# Patient Record
Sex: Female | Born: 1975 | Race: Black or African American | Hispanic: No | Smoking: Never smoker
Health system: Southern US, Community
[De-identification: ages and names within clinical notes are randomized; demographics above are authoritative.]

## PROBLEM LIST (undated history)

## (undated) DIAGNOSIS — O24419 Gestational diabetes mellitus in pregnancy, unspecified control: Secondary | ICD-10-CM

## (undated) HISTORY — PX: DILATION AND CURETTAGE OF UTERUS: SHX78

---

## 1998-09-08 ENCOUNTER — Encounter: Admission: RE | Admit: 1998-09-08 | Discharge: 1998-09-08 | Payer: Self-pay | Admitting: Family Medicine

## 1998-09-11 ENCOUNTER — Emergency Department (HOSPITAL_COMMUNITY): Admission: EM | Admit: 1998-09-11 | Discharge: 1998-09-11 | Payer: Self-pay | Admitting: Emergency Medicine

## 1998-09-17 ENCOUNTER — Encounter: Admission: RE | Admit: 1998-09-17 | Discharge: 1998-09-17 | Payer: Self-pay | Admitting: Family Medicine

## 1998-10-09 ENCOUNTER — Encounter: Admission: RE | Admit: 1998-10-09 | Discharge: 1998-10-09 | Payer: Self-pay | Admitting: Family Medicine

## 1999-06-04 ENCOUNTER — Other Ambulatory Visit: Admission: RE | Admit: 1999-06-04 | Discharge: 1999-06-04 | Payer: Self-pay | Admitting: Obstetrics and Gynecology

## 2000-03-07 ENCOUNTER — Other Ambulatory Visit: Admission: RE | Admit: 2000-03-07 | Discharge: 2000-03-07 | Payer: Self-pay | Admitting: Gynecology

## 2001-03-09 ENCOUNTER — Other Ambulatory Visit: Admission: RE | Admit: 2001-03-09 | Discharge: 2001-03-09 | Payer: Self-pay | Admitting: Gynecology

## 2001-11-10 ENCOUNTER — Emergency Department (HOSPITAL_COMMUNITY): Admission: EM | Admit: 2001-11-10 | Discharge: 2001-11-10 | Payer: Self-pay | Admitting: Emergency Medicine

## 2002-03-12 ENCOUNTER — Other Ambulatory Visit: Admission: RE | Admit: 2002-03-12 | Discharge: 2002-03-12 | Payer: Self-pay | Admitting: Gynecology

## 2003-03-18 ENCOUNTER — Other Ambulatory Visit: Admission: RE | Admit: 2003-03-18 | Discharge: 2003-03-18 | Payer: Self-pay | Admitting: Gynecology

## 2003-06-25 ENCOUNTER — Other Ambulatory Visit: Admission: RE | Admit: 2003-06-25 | Discharge: 2003-06-25 | Payer: Self-pay | Admitting: Gynecology

## 2005-06-06 ENCOUNTER — Encounter: Admission: RE | Admit: 2005-06-06 | Discharge: 2005-09-04 | Payer: Self-pay | Admitting: Obstetrics and Gynecology

## 2005-09-27 ENCOUNTER — Inpatient Hospital Stay (HOSPITAL_COMMUNITY): Admission: AD | Admit: 2005-09-27 | Discharge: 2005-09-27 | Payer: Self-pay | Admitting: *Deleted

## 2005-09-29 ENCOUNTER — Encounter (INDEPENDENT_AMBULATORY_CARE_PROVIDER_SITE_OTHER): Payer: Self-pay | Admitting: Specialist

## 2005-09-29 ENCOUNTER — Ambulatory Visit (HOSPITAL_COMMUNITY): Admission: AD | Admit: 2005-09-29 | Discharge: 2005-09-29 | Payer: Self-pay | Admitting: Obstetrics and Gynecology

## 2006-04-11 ENCOUNTER — Encounter (INDEPENDENT_AMBULATORY_CARE_PROVIDER_SITE_OTHER): Payer: Self-pay | Admitting: *Deleted

## 2006-04-11 ENCOUNTER — Inpatient Hospital Stay (HOSPITAL_COMMUNITY): Admission: AD | Admit: 2006-04-11 | Discharge: 2006-04-11 | Payer: Self-pay | Admitting: Obstetrics and Gynecology

## 2006-05-26 ENCOUNTER — Ambulatory Visit (HOSPITAL_COMMUNITY): Admission: RE | Admit: 2006-05-26 | Discharge: 2006-05-26 | Payer: Self-pay | Admitting: Obstetrics and Gynecology

## 2007-06-27 ENCOUNTER — Ambulatory Visit (HOSPITAL_COMMUNITY): Admission: RE | Admit: 2007-06-27 | Discharge: 2007-06-27 | Payer: Self-pay | Admitting: Obstetrics and Gynecology

## 2007-07-07 ENCOUNTER — Inpatient Hospital Stay (HOSPITAL_COMMUNITY): Admission: AD | Admit: 2007-07-07 | Discharge: 2007-07-07 | Payer: Self-pay | Admitting: Obstetrics and Gynecology

## 2007-10-09 ENCOUNTER — Encounter: Admission: RE | Admit: 2007-10-09 | Discharge: 2007-10-09 | Payer: Self-pay | Admitting: Obstetrics and Gynecology

## 2007-12-03 ENCOUNTER — Inpatient Hospital Stay (HOSPITAL_COMMUNITY): Admission: AD | Admit: 2007-12-03 | Discharge: 2007-12-03 | Payer: Self-pay | Admitting: Obstetrics and Gynecology

## 2007-12-19 ENCOUNTER — Inpatient Hospital Stay (HOSPITAL_COMMUNITY): Admission: RE | Admit: 2007-12-19 | Discharge: 2007-12-21 | Payer: Self-pay | Admitting: Obstetrics and Gynecology

## 2007-12-22 ENCOUNTER — Encounter: Admission: RE | Admit: 2007-12-22 | Discharge: 2008-01-18 | Payer: Self-pay | Admitting: Obstetrics and Gynecology

## 2008-01-19 ENCOUNTER — Encounter: Admission: RE | Admit: 2008-01-19 | Discharge: 2008-02-18 | Payer: Self-pay | Admitting: Obstetrics and Gynecology

## 2008-01-30 ENCOUNTER — Encounter: Payer: Self-pay | Admitting: Family

## 2008-02-19 ENCOUNTER — Encounter: Admission: RE | Admit: 2008-02-19 | Discharge: 2008-03-19 | Payer: Self-pay | Admitting: Obstetrics and Gynecology

## 2008-03-20 ENCOUNTER — Encounter: Admission: RE | Admit: 2008-03-20 | Discharge: 2008-04-19 | Payer: Self-pay | Admitting: Obstetrics and Gynecology

## 2008-04-20 ENCOUNTER — Encounter: Admission: RE | Admit: 2008-04-20 | Discharge: 2008-05-19 | Payer: Self-pay | Admitting: Obstetrics and Gynecology

## 2008-05-20 ENCOUNTER — Encounter: Admission: RE | Admit: 2008-05-20 | Discharge: 2008-06-19 | Payer: Self-pay | Admitting: Obstetrics and Gynecology

## 2008-06-20 ENCOUNTER — Encounter: Admission: RE | Admit: 2008-06-20 | Discharge: 2008-07-20 | Payer: Self-pay | Admitting: Obstetrics and Gynecology

## 2008-07-21 ENCOUNTER — Encounter: Admission: RE | Admit: 2008-07-21 | Discharge: 2008-08-19 | Payer: Self-pay | Admitting: Obstetrics and Gynecology

## 2008-08-20 ENCOUNTER — Encounter: Admission: RE | Admit: 2008-08-20 | Discharge: 2008-09-19 | Payer: Self-pay | Admitting: Obstetrics and Gynecology

## 2008-09-20 ENCOUNTER — Encounter: Admission: RE | Admit: 2008-09-20 | Discharge: 2008-10-19 | Payer: Self-pay | Admitting: Obstetrics and Gynecology

## 2008-10-20 ENCOUNTER — Encounter: Admission: RE | Admit: 2008-10-20 | Discharge: 2008-11-08 | Payer: Self-pay | Admitting: Obstetrics and Gynecology

## 2009-11-23 LAB — CONVERTED CEMR LAB: Pap Smear: NORMAL

## 2009-12-02 ENCOUNTER — Ambulatory Visit: Payer: Self-pay | Admitting: Family

## 2009-12-02 DIAGNOSIS — I10 Essential (primary) hypertension: Secondary | ICD-10-CM | POA: Insufficient documentation

## 2009-12-02 DIAGNOSIS — E669 Obesity, unspecified: Secondary | ICD-10-CM | POA: Insufficient documentation

## 2009-12-03 ENCOUNTER — Telehealth: Payer: Self-pay | Admitting: Family

## 2009-12-14 ENCOUNTER — Ambulatory Visit: Payer: Self-pay | Admitting: Family

## 2009-12-14 LAB — CONVERTED CEMR LAB
BUN: 20 mg/dL (ref 6–23)
CO2: 29 meq/L (ref 19–32)
Calcium: 9.6 mg/dL (ref 8.4–10.5)
Chloride: 98 meq/L (ref 96–112)
Cholesterol: 207 mg/dL — ABNORMAL HIGH (ref 0–200)
Creatinine, Ser: 1.3 mg/dL — ABNORMAL HIGH (ref 0.40–1.20)
Glucose, Bld: 101 mg/dL — ABNORMAL HIGH (ref 70–99)
HDL: 41 mg/dL (ref >39)
LDL Cholesterol: 144 mg/dL — ABNORMAL HIGH (ref 0–99)
Potassium: 3.5 meq/L (ref 3.5–5.3)
Sodium: 139 meq/L (ref 135–145)
TSH: 0.643 microintl units/mL (ref 0.350–4.500)
Total CHOL/HDL Ratio: 5
Triglycerides: 111 mg/dL (ref <150)
VLDL: 22 mg/dL (ref 0–40)

## 2009-12-15 ENCOUNTER — Encounter (INDEPENDENT_AMBULATORY_CARE_PROVIDER_SITE_OTHER): Payer: Self-pay | Admitting: *Deleted

## 2009-12-15 ENCOUNTER — Telehealth (INDEPENDENT_AMBULATORY_CARE_PROVIDER_SITE_OTHER): Payer: Self-pay | Admitting: *Deleted

## 2009-12-21 ENCOUNTER — Ambulatory Visit: Payer: Self-pay | Admitting: Family

## 2009-12-21 DIAGNOSIS — Z87898 Personal history of other specified conditions: Secondary | ICD-10-CM

## 2009-12-21 LAB — CONVERTED CEMR LAB
BUN: 13 mg/dL (ref 6–23)
CO2: 28 meq/L (ref 19–32)
Calcium: 9.7 mg/dL (ref 8.4–10.5)
Chloride: 99 meq/L (ref 96–112)
Creatinine, Ser: 1.15 mg/dL (ref 0.40–1.20)
Glucose, Bld: 100 mg/dL — ABNORMAL HIGH (ref 70–99)
Hgb A1c MFr Bld: 5.8 % (ref 4.6–6.1)
Potassium: 2.9 meq/L — ABNORMAL LOW (ref 3.5–5.3)
Sodium: 139 meq/L (ref 135–145)

## 2009-12-22 ENCOUNTER — Telehealth: Payer: Self-pay | Admitting: Family

## 2009-12-30 ENCOUNTER — Ambulatory Visit: Payer: Self-pay | Admitting: Family

## 2009-12-30 LAB — CONVERTED CEMR LAB
BUN: 10 mg/dL (ref 6–23)
CO2: 28 meq/L (ref 19–32)
Calcium: 8.8 mg/dL (ref 8.4–10.5)
Chloride: 104 meq/L (ref 96–112)
Creatinine, Ser: 1.03 mg/dL (ref 0.40–1.20)
Glucose, Bld: 87 mg/dL (ref 70–99)
Potassium: 3.4 meq/L — ABNORMAL LOW (ref 3.5–5.3)
Sodium: 141 meq/L (ref 135–145)

## 2009-12-31 ENCOUNTER — Encounter: Payer: Self-pay | Admitting: Family

## 2010-02-16 ENCOUNTER — Ambulatory Visit: Payer: Self-pay | Admitting: Internal Medicine

## 2010-03-22 ENCOUNTER — Telehealth: Payer: Self-pay | Admitting: Family

## 2010-11-30 NOTE — Assessment & Plan Note (Signed)
Summary: 1 week follow up of labs/tf [facility] WITH PT/MHF   Vital Signs:  Patient profile:   35 year old female Weight:      225.50 pounds BMI:     35.98 Temp:     98.3 degrees F oral Pulse rate:   76 / minute Pulse rhythm:   regular Resp:     16 per minute BP sitting:   128 / 88  (right arm) Cuff size:   large  Vitals Entered By: Mervin Kung CMA (December 30, 2009 3:52 PM) CC: room 3   ! week f/u on BP and headache. Comments Pt states headaches have improved.   CC:  room 3   ! week f/u on BP and headache..  History of Present Illness: Ms Marut is a 35 year old female who presents today in follow up for her HTN and her Migraines.  Last visit she had a BMET drawn which noted a low potassium (2.9).  She was given some potassium supplementation and HCTZ was discontinued.  She was in place started on labetalol which she has been tolerating well.  Patient denies LE edema.  Migraines- patient tells me that she has taken a vacation at her job and her headaches have resolved.  She has not needed to use the imitrex.    Allergies: 1)  ! Penicillin  Physical Exam  General:  Well-developed,well-nourished,in no acute distress; alert,appropriate and cooperative throughout examination Head:  Normocephalic and atraumatic without obvious abnormalities. No apparent alopecia or balding. Lungs:  Normal respiratory effort, chest expands symmetrically. Lungs are clear to auscultation, no crackles or wheezes. Heart:  Normal rate and regular rhythm. S1 and S2 normal without gallop, murmur, click, rub or other extra sounds.   Impression & Recommendations:  Problem # 1:  HYPERTENSION (ICD-401.9) Assessment Unchanged BP control is reasonable-  patient will work hard on diet and exercise as well as low sodium intake.  Tells me she is motivated to do so.  Will continue current dose of labetalol and plan f/u in 2 months. Her updated medication list for this problem includes:    Labetalol Hcl 100 Mg Tabs  (Labetalol hcl) ..... One tablet by mouth bid  BP today: 128/88 Prior BP: 122/88 (12/21/2009)  Labs Reviewed: K+: 2.9 (12/21/2009) Creat: : 1.15 (12/21/2009)   Chol: 207 (12/14/2009)   HDL: 41 (12/14/2009)   LDL: 144 (12/14/2009)   TG: 111 (12/14/2009)  Problem # 2:  MIGRAINES, HX OF (ICD-V13.8) Assessment: Improved Worsened by stress, now resolved.  Monitor- patient plans to change work hours to a less stressful schedule as well.  Problem # 3:  HYPOKALEMIA (ICD-276.8) Assessment: New Was likely due to HCTZ- repeat BMET today Orders: T-Basic Metabolic Panel [id number])  Complete Medication List: 1)  Labetalol Hcl 100 Mg Tabs (Labetalol hcl) .... One tablet by mouth bid 2)  Imitrex 50 Mg Tabs (Sumatriptan succinate) .... Take one tablet as needed for headache.  may repeat in 2 hours x 1 as needed. 3)  Klor-con 20 Meq Pack (Potassium chloride) .... Take two tablets this am and two tablets this evening.  Patient Instructions: 1)  Please work hard on diet exercise and weight loss.   2)  Follow up in 2 months, sooner if problems or concerns.   Current Allergies (reviewed today): ! PENICILLIN

## 2010-11-30 NOTE — Progress Notes (Signed)
Summary: lab results--lmom  Phone Note Outgoing Call   Summary of Call: Please call patient and let her know that I would like to stop her HCTZ due to low potassium.  I will switch her to a different medicine for her BP which may also help her headaches.  She should take the potassium supplement x 1.  Follow up in 1 week. Her diabetic test (A1C) is normal.   Initial call taken by: Lemont Fillers FNP,  December 22, 2009 10:30 PM  Follow-up for Phone Call        left message on machine to return call. Follow-up by: Mervin Kung CMA,  December 23, 2009 10:49 AM  Additional Follow-up for Phone Call Additional follow up Details #1::        left message on machine to return call Additional Follow-up by: Mervin Kung CMA,  December 24, 2009 8:32 AM    Additional Follow-up for Phone Call Additional follow up Details #2::    Notified pt. per Sandford Craze, NP to stop HCTZ due to low potassium. She will pick up new meds and will follow up with Caro Brundidge on 12/29/09. Follow-up by: Mervin Kung CMA,  December 24, 2009 8:44 AM  New/Updated Medications: LABETALOL HCL 100 MG TABS (LABETALOL HCL) one tablet by mouth BID KLOR-CON 20 MEQ PACK (POTASSIUM CHLORIDE) take two tablets this AM and two tablets this evening. Prescriptions: KLOR-CON 20 MEQ PACK (POTASSIUM CHLORIDE) take two tablets this AM and two tablets this evening.  #4 x 0   Entered and Authorized by:   Lemont Fillers FNP   Signed by:   Lemont Fillers FNP on 12/22/2009   Method used:   [facility] to        [facility] [phone]* (retail)       [address]       [place], [place]  [address]       Ph: [phone]       Fax: [phone]   RxID:   [id number] LABETALOL HCL 100 MG TABS (LABETALOL HCL) one tablet by mouth BID  #60 x 0   Entered and Authorized by:   Lemont Fillers FNP   Signed by:   Lemont Fillers FNP on 12/22/2009   Method used:    [facility]ically to        [facility] [phone][facility] (retail)       [address]       [place], [place]  [address]       Ph: [phone]       Fax: [phone]   RxID:   [id number]

## 2010-11-30 NOTE — Progress Notes (Signed)
Summary: obgyn faxing bloodwork and pap records  Phone Note Outgoing Call   Call placed by: marj Call placed to: obgyn Summary of Call: they are faxing her blood work and pap records  Initial call taken by: Roselle Locus,  December 03, 2009 10:34 AM  Follow-up for Phone Call        records received  Roselle Locus  December 03, 2009 10:43 AM

## 2010-11-30 NOTE — Letter (Signed)
   [place] [facility] [address] [place], [place]  [address]  [place] Phone: [phone]      December 31, 2009   Alexandra Walls [address] [place], [place] [address]  RE:  LAB RESULTS  Dear  Ms. Cortright,  The following is an interpretation of your most recent lab tests.  Please take note of any instructions provided or changes to medications that have resulted from your lab work.  ELECTROLYTES:  Stable - no changes needed     Your postassium is improved- just slightly low.  We will plan to repeat at your next visit in April   Sincerely Yours,    Lemont Fillers FNP

## 2010-11-30 NOTE — Progress Notes (Signed)
Summary: f/u appt  Phone Note Outgoing Call   Call placed by: Lemont Fillers FNP,  December 15, 2009 5:08 PM Summary of Call: Please call patient and arrange follow up visit.  I would like to do more testing related to her kidneys and her sugar.  She also needs follow up of her BP. Initial call taken by: Lemont Fillers FNP,  December 15, 2009 5:09 PM  Follow-up for Phone Call        left message on machine .Marland KitchenMarland KitchenDoristine Walls  December 16, 2009 4:15 PM   spoke w/ patient appt scheduled for f/u this coming week...Marland KitchenMarland KitchenDoristine Walls  December 18, 2009 10:00 AM

## 2010-11-30 NOTE — Letter (Signed)
Summary: [name], [place] [address]   Phone: [phone]  Fax: [phone]    12/15/2009 MRN: [id number]  Alexandra Walls [address] [place], [place]  [address]   [name],   Our records indicate that you missed your scheduled appointment with ___Melissa Peggyann Juba, FNP____________ on _____2/15/11_____.  Please contact this office to reschedule your appointment as soon as possible.  It is important that you keep your scheduled appointments with your physician, so we can provide you the best care possible.  Please be advised that there may be a charge for "no show" appointments.    Sincerely,   [place] [facility]

## 2010-11-30 NOTE — Progress Notes (Signed)
Summary: refill request, needs appt--lm  Phone Note [facility]ill Request Message [facility]  [phone] on 03/21/2010 4:25pm  Refills Requested: Medication #1:  LABETALOL HCL 100 MG TABS one tablet by mouth BID   Dosage confirmed as above?Dosage Confirmed   Supply Requested: 3 months   Last Refilled: 02/26/2010   Notes: Requesting 90 day supply at a time Home number was a fax machine.  Left message on [phone] voicemail to return my call.  Pt was due for f/u in May. Needs appt.  Next Appointment Scheduled: none Initial call taken by: Mervin Kung CMA,  Mar 22, 2010 2:07 PM  Follow-up for Phone Call        Home # still a fax #.  Left message on voicemail [phone]  to return my call.  Mervin Kung CMA  Mar 23, 2010 11:44 AM   Additional Follow-up for Phone Call Additional follow up Details #1::        Left message on pharmacy voicemail that refills are denied and pt needs to contact our office.  Still unable to reach pt or leave message at home number.  Mervin Kung CMA  Mar 26, 2010 10:19 AM

## 2010-11-30 NOTE — Assessment & Plan Note (Signed)
Summary: BP F/U AND LABS/CDJ   Vital Signs:  Patient profile:   35 year old female Weight:      222 pounds BMI:     35.42 Temp:     98.1 degrees F oral Pulse rate:   88 / minute Pulse rhythm:   regular Resp:     16 per minute BP sitting:   122 / 88  (right arm) Cuff size:   large  Vitals Entered By: Pearletha Furl CMA (December 21, 2009 3:41 PM) CC: Room 5  Follow up blood pressure Is Patient Diabetic? No Comments Headaches started 2 months ago and getting worse.   CC:  Room 5  Follow up blood pressure.  History of Present Illness: Alexandra Walls is a 35 year old female who presents today for follow up of her blood pressure.  Notes that she has had increased frequency of HA's which often last 1-2 days and are associated with photophobia and phonophobia.  Not relieved by tylenol.  She denies personal history of migraines, but tells me that her mother does have + migraine history.  Sleep is the only thing that helps her headache.  Notes thes HA's were "once in a blue moon," until 2-3 months ago, now they are occuring about once a week.  + associated nausea but no vomitting.  Pain starts on one side of head, then may move to other side of head.    Preventive Screening-Counseling & Management  Alcohol-Tobacco     Smoking Status: never  Allergies: 1)  ! Penicillin  Social History: Smoking Status:  never  Physical Exam  General:  Well-developed,well-nourished,in no acute distress; alert,appropriate and cooperative throughout examination Lungs:  Normal respiratory effort, chest expands symmetrically. Lungs are clear to auscultation, no crackles or wheezes. Heart:  Normal rate and regular rhythm. S1 and S2 normal without gallop, murmur, click, rub or other extra sounds.   Impression & Recommendations:  Problem # 1:  HYPERTENSION (ICD-401.9) Assessment Improved Plan to continue HCTZ, nearing goal.  Patient counselled on low sodium diet, exercise and weight loss Her updated  medication list for this problem includes:    Hydrochlorothiazide 25 Mg Tabs (Hydrochlorothiazide) ..... One tablet by mouth daily  BP today: 122/88 Prior BP: 140/96 (12/02/2009)  Labs Reviewed: K+: 3.5 (12/14/2009) Creat: : 1.30 (12/14/2009)   Chol: 207 (12/14/2009)   HDL: 41 (12/14/2009)   LDL: 144 (12/14/2009)   TG: 111 (12/14/2009)  Orders: T-Basic Metabolic Panel [id number])  Problem # 2:  MIGRAINES, HX OF (ICD-V13.8) Assessment: New Will give trial of imitrex. If no improvement consider referral to HA clinic.  She works nights and plans to return to days, hopefully this will help her HA's as well.    Problem # 3:  HYPERGLYCEMIA (ICD-790.29) Assessment: New mild hyperglycemia on fasting labs, check A1C Orders: T-Hgb A1C ([id number])  Complete Medication List: 1)  Hydrochlorothiazide 25 Mg Tabs (Hydrochlorothiazide) .... One tablet by mouth daily 2)  Imitrex 50 Mg Tabs (Sumatriptan succinate) .... Take one tablet as needed for headache.  may repeat in 2 hours x 1 as needed.  Patient Instructions: 1)  Call if your headaches are not improved with imitrex. 2)  Please schedule a follow-up appointment in 3 months. Prescriptions: IMITREX 50 MG TABS (SUMATRIPTAN SUCCINATE) take one tablet as needed for headache.  May repeat in 2 hours x 1 as needed.  #10 x 0   Entered and Authorized by:   Lemont Fillers FNP   Signed by:   Efraim Kaufmann  Arvil Chaco FNP on 12/21/2009   Method used:   [facility] to        [facility] [phone][facility] (retail)       [address]       [place], [place]  [address]       Ph: [phone]       Fax: [phone]   RxID:   [id number]   Current Allergies (reviewed today): ! PENICILLIN   Immunization History:  Influenza Immunization History:    Influenza:  historical (07/31/2008)

## 2010-11-30 NOTE — Assessment & Plan Note (Signed)
Summary: new to est//lh   Vital Signs:  Patient profile:   35 year old female Height:      66.50 inches Weight:      225 pounds BMI:     35.90 Pulse rate:   70 / minute BP sitting:   140 / 96  (right arm)  Vitals Entered By: Doristine Devoid (December 02, 2009 2:09 PM) CC: NEW EST- ELEVATED BP    CC:  NEW EST- ELEVATED BP .  History of Present Illness: Ms Leys is a 35 year old female who presents to establish care.  Notes that she went to the health department for her yearly physical and was noted to have high blood pressure.  She denies history of high blood pressure in the past.  Preventative - last PAP - last monday, awaiting results (done at health department) Does not exercise. Notes that her diet is not that good- works 3rd shift. 12A-9A.  Notes that her weight has been the same for about 2 years.  Last tetanus shot was more than 10 years ago, declines flu shot today  Allergies (verified): 1)  ! Penicillin  Past History:  Past Medical History: Hypertension obesity  Past Surgical History: Tonsillectomy  Family History: CAD-maternal grandmother MI HTN-mother,maternal grandmother, maternal aunts & uncles DM-maternal grandmother, maternal aunts & uncles STROKE-maternal grandmother COLON CA-no BREAST CA-no  Mom- HTN, DM, ?hyperthyroid, overweight, migraines Dad- deceased, died from lung CA age 9  1/2 brother- overweight 1/2 sister-overweight 1 daughter- born 1-healthy  Social History: married works for [employer] as a Production designer, theatre/television/film never smoked occasional ETOH denies drug use  Review of Systems       Constitutional: Denies Fever ENT:  Denies nasal congestion or sore throat. Resp: Denies cough CV:  Denies Chest Pain GI:  Denies nausea or vomitting GU: Denies dysuria Lymphatic: Denies lymphadenopathy Musculoskeletal:  Denies muscle/joint pain Skin:  Denies Rashes Psychiatric: Denies depression Neuro:  + ha's come and go, last HA lasted 1 week and resolved  with sleep     Physical Exam  General:  Well-developed, overweight AA female, in no acute distress; alert,appropriate and cooperative throughout examination Head:  Normocephalic and atraumatic without obvious abnormalities. No apparent alopecia or balding. Eyes:  PERRLA Ears:  External ear exam shows no significant lesions or deformities.  Otoscopic examination reveals clear canals, tympanic membranes are intact bilaterally without bulging, retraction, inflammation or discharge. Hearing is grossly normal bilaterally. Mouth:  Oral mucosa and oropharynx without lesions or exudates.  Teeth in good repair. Neck:  No deformities, masses, or tenderness noted. Breasts:  declines-had breast exam on monday at health dept.  Lungs:  Normal respiratory effort, chest expands symmetrically. Lungs are clear to auscultation, no crackles or wheezes. Heart:  Normal rate and regular rhythm. S1 and S2 normal without gallop, murmur, click, rub or other extra sounds. Abdomen:  Bowel sounds positive,abdomen soft and non-tender without masses, organomegaly or hernias noted. Msk:  normal ROM and no joint swelling.   Extremities:  No clubbing, cyanosis, edema, or deformity noted with normal full range of motion of all joints.   Neurologic:  alert & oriented X3, strength normal in all extremities, gait normal, and DTRs symmetrical and normal.   Skin:  Intact without suspicious lesions or rashes Cervical Nodes:  No lymphadenopathy noted Psych:  Cognition and judgment appear intact. Alert and cooperative with normal attention span and concentration. No apparent delusions, illusions, hallucinations   Impression & Recommendations:  Problem # 1:  Preventive  Health Care (ICD-V70.0) Will plan tetanus today.  Counselled on exercise and diet and weight loss  Problem # 2:  HYPERTENSION (ICD-401.9) Assessment: New Will start HCTZ, plan patient to f/u in 2 weeks for f/u BP check and BMET Her updated medication list for  this problem includes:    Hydrochlorothiazide 25 Mg Tabs (Hydrochlorothiazide) ..... One tablet by mouth daily  Problem # 3:  OBESITY (ICD-278.00) Assessment: Unchanged Patient counselled on weight loss.  Complete Medication List: 1)  Hydrochlorothiazide 25 Mg Tabs (Hydrochlorothiazide) .... One tablet by mouth daily  Other Orders: Tdap => 3yrs IM ([id number]) Admin 1st Vaccine ([id number])  Patient Instructions: 1)  It is important that you exercise regularly at least 20 minutes 5 times a week. If you develop chest pain, have severe difficulty breathing, or feel very tired , stop exercising immediately and seek medical attention. 2)  You need to lose weight. Consider a lower calorie diet and regular exercise.  3)  Limit your Sodium (Salt). 4)  Please return fasting for the following labs: 5)  BMET, TSH, FLP (v70) 6)  Please schedule a follow-up appointment in 2 weeks. Prescriptions: HYDROCHLOROTHIAZIDE 25 MG TABS (HYDROCHLOROTHIAZIDE) one tablet by mouth daily  #30 x 1   Entered and Authorized by:   Lemont Fillers FNP   Signed by:   Lemont Fillers FNP on 12/02/2009   Method used:   [facility] to        [facility] [phone]* (retail)       [address]       [place], [place]  [address]       Ph: [phone]       Fax: [phone]   RxID:   [id number]    Preventive Care Screening  Pap Smear:    Date:  11/23/2009    Results:  normal     Immunizations Administered:  Tetanus Vaccine:    Vaccine Type: Tdap    Site: left deltoid    Mfr: GlaxoSmithKline    Dose: 0.5 ml    Route: IM    Given by: Doristine Devoid    Exp. Date: 12/26/2011    Lot #: [id number]

## 2011-03-15 NOTE — Op Note (Signed)
Alexandra Walls, Alexandra Walls             ACCOUNT NO.:  [id number]   MEDICAL RECORD NO.:  [id number]          PATIENT TYPE:  AMB   LOCATION:  SDC                           FACILITY:  [facility]   PHYSICIAN:  Maxie Better, M.D.DATE OF BIRTH:  08/22/1976   DATE OF PROCEDURE:  06/27/2007  DATE OF DISCHARGE:                               OPERATIVE REPORT   PREOPERATIVE DIAGNOSES:  1. Cervical incompetence.  2. Intrauterine gestation at 14+ weeks.   PROCEDURE:  McDonald cerclage placement.   POSTOPERATIVE DIAGNOSES:  1. Intrauterine gestation of 14+ weeks.  2. Cervical incompetence.   ANESTHESIA:  Spinal.   SURGEON:  Maxie Better, M.D.   ASSISTANT:  Marlinda Mike, C.N.M.   PROCEDURE:  Under adequate spinal anesthesia, the patient is placed in  the dorsal lithotomy position.  She was externally prepped and draped in  the usual fashion.  Bladder was catheterized for a small amount of  urine.  A weighted speculum was placed in the vagina.  The vagina was  then prepped with sterile water.  The anterior posterior lip of the  cervix was grasped.  Cervix appeared normal in length.  A #1 Prolene  suture was utilized to place a McDonald cerclage starting at 12 o'clock.  The anterior knot was placed at 12 o'clock with a marker using Ethicon  suture.  Some bleeding was noted from the clamping of the cervix which  abated prior to the procedure being complete, at which time all  instruments were then removed from the vagina.   SPECIMEN:  None.   ESTIMATED BLOOD LOSS:  Minimal.   COMPLICATION:  None.   The patient tolerated the procedure well and was transferred to the  recovery room in stable condition.  Fetal heart rate post procedure was  160.     Maxie Better, M.D.  Electronically Signed    [place]/MEDQ  D:  06/27/2007  T:  06/28/2007  Job:  [id number]

## 2011-07-22 LAB — CBC
HCT: 35.3 — ABNORMAL LOW
HCT: 35.8 — ABNORMAL LOW
Hemoglobin: 12
Hemoglobin: 12.5
MCHC: 34
MCHC: 34.8
MCV: 91.7
MCV: 93.2
Platelets: 113 — ABNORMAL LOW
Platelets: 114 — ABNORMAL LOW
RBC: 3.79 — ABNORMAL LOW
RBC: 3.9
RDW: 14.9
RDW: 15.1
WBC: 12.5 — ABNORMAL HIGH
WBC: 9.2

## 2011-07-22 LAB — COMPREHENSIVE METABOLIC PANEL
ALT: 10
AST: 13
Albumin: 2.8 — ABNORMAL LOW
Alkaline Phosphatase: 108
BUN: 3 — ABNORMAL LOW
CO2: 24
Calcium: 9.2
Chloride: 102
Creatinine, Ser: 0.67
GFR calc Af Amer: >60
GFR calc non Af Amer: >60
Glucose, Bld: 84
Potassium: 3.5
Sodium: 135
Total Bilirubin: 1
Total Protein: 5.6 — ABNORMAL LOW

## 2011-07-22 LAB — RPR: RPR Ser Ql: NONREACTIVE

## 2011-07-22 LAB — LACTATE DEHYDROGENASE: LDH: 108

## 2011-07-22 LAB — URIC ACID: Uric Acid, Serum: 3.4

## 2011-07-22 LAB — CCBB MATERNAL DONOR DRAW

## 2011-08-12 LAB — CBC
HCT: 35.9 — ABNORMAL LOW
Hemoglobin: 12.2
MCHC: 34.1
MCV: 90.8
Platelets: 157
RBC: 3.96
RDW: 13.3
WBC: 8.8

## 2011-08-12 LAB — CARDIOLIPIN ANTIBODIES, IGM+IGG
Anticardiolipin IgG: <7 — ABNORMAL LOW (ref <11)
Anticardiolipin IgM: <7 — ABNORMAL LOW (ref <10)

## 2012-03-19 ENCOUNTER — Other Ambulatory Visit: Payer: Self-pay | Admitting: Family Medicine

## 2012-03-19 ENCOUNTER — Other Ambulatory Visit (HOSPITAL_COMMUNITY)
Admission: RE | Admit: 2012-03-19 | Discharge: 2012-03-19 | Disposition: A | Discharge status: Home / Self Care | Payer: 59 | Source: Ambulatory Visit | Attending: Family Medicine | Admitting: Family Medicine

## 2012-03-19 DIAGNOSIS — Z124 Encounter for screening for malignant neoplasm of cervix: Secondary | ICD-10-CM | POA: Insufficient documentation

## 2012-03-19 DIAGNOSIS — Z1159 Encounter for screening for other viral diseases: Secondary | ICD-10-CM | POA: Insufficient documentation

## 2015-07-27 ENCOUNTER — Ambulatory Visit (INDEPENDENT_AMBULATORY_CARE_PROVIDER_SITE_OTHER): Payer: PRIVATE HEALTH INSURANCE | Admitting: Psychology

## 2015-07-27 DIAGNOSIS — F4323 Adjustment disorder with mixed anxiety and depressed mood: Secondary | ICD-10-CM | POA: Diagnosis not present

## 2015-08-12 ENCOUNTER — Ambulatory Visit: Payer: PRIVATE HEALTH INSURANCE | Admitting: Psychology

## 2020-10-25 ENCOUNTER — Emergency Department (INDEPENDENT_AMBULATORY_CARE_PROVIDER_SITE_OTHER)
Admission: EM | Admit: 2020-10-25 | Discharge: 2020-10-25 | Disposition: A | Discharge status: Home / Self Care | Payer: BC Managed Care – PPO | Source: Home / Self Care

## 2020-10-25 ENCOUNTER — Inpatient Hospital Stay (HOSPITAL_COMMUNITY)
Admission: EM | Admit: 2020-10-25 | Discharge: 2020-10-29 | DRG: 638 | Disposition: A | Discharge status: Home / Self Care | Payer: BC Managed Care – PPO | Attending: Student in an Organized Health Care Education/Training Program | Admitting: Student in an Organized Health Care Education/Training Program

## 2020-10-25 ENCOUNTER — Encounter (HOSPITAL_COMMUNITY): Payer: Self-pay | Admitting: Emergency Medicine

## 2020-10-25 ENCOUNTER — Other Ambulatory Visit: Payer: Self-pay

## 2020-10-25 ENCOUNTER — Emergency Department: Admit: 2020-10-25 | Discharge status: Absent without leave | Payer: Self-pay

## 2020-10-25 DIAGNOSIS — E119 Type 2 diabetes mellitus without complications: Secondary | ICD-10-CM | POA: Diagnosis not present

## 2020-10-25 DIAGNOSIS — E1110 Type 2 diabetes mellitus with ketoacidosis without coma: Secondary | ICD-10-CM | POA: Diagnosis present

## 2020-10-25 DIAGNOSIS — Z6833 Body mass index (BMI) 33.0-33.9, adult: Secondary | ICD-10-CM | POA: Diagnosis not present

## 2020-10-25 DIAGNOSIS — Z8632 Personal history of gestational diabetes: Secondary | ICD-10-CM

## 2020-10-25 DIAGNOSIS — Z833 Family history of diabetes mellitus: Secondary | ICD-10-CM

## 2020-10-25 DIAGNOSIS — R000 Tachycardia, unspecified: Secondary | ICD-10-CM | POA: Diagnosis present

## 2020-10-25 DIAGNOSIS — R011 Cardiac murmur, unspecified: Secondary | ICD-10-CM | POA: Diagnosis present

## 2020-10-25 DIAGNOSIS — E669 Obesity, unspecified: Secondary | ICD-10-CM | POA: Diagnosis present

## 2020-10-25 DIAGNOSIS — Z880 Allergy status to penicillin: Secondary | ICD-10-CM | POA: Diagnosis not present

## 2020-10-25 DIAGNOSIS — E860 Dehydration: Secondary | ICD-10-CM | POA: Diagnosis present

## 2020-10-25 DIAGNOSIS — R739 Hyperglycemia, unspecified: Secondary | ICD-10-CM

## 2020-10-25 DIAGNOSIS — R319 Hematuria, unspecified: Secondary | ICD-10-CM | POA: Diagnosis present

## 2020-10-25 DIAGNOSIS — N179 Acute kidney failure, unspecified: Secondary | ICD-10-CM | POA: Diagnosis present

## 2020-10-25 DIAGNOSIS — Z20822 Contact with and (suspected) exposure to covid-19: Secondary | ICD-10-CM | POA: Diagnosis present

## 2020-10-25 HISTORY — DX: Gestational diabetes mellitus in pregnancy, unspecified control: O24.419

## 2020-10-25 LAB — URINALYSIS, ROUTINE W REFLEX MICROSCOPIC
Bacteria, UA: NONE SEEN
Bilirubin Urine: NEGATIVE
Glucose, UA: >=500 mg/dL — AB
Ketones, ur: 20 mg/dL — AB
Leukocytes,Ua: NEGATIVE
Nitrite: NEGATIVE
Protein, ur: NEGATIVE mg/dL
Specific Gravity, Urine: 1.031 — ABNORMAL HIGH (ref 1.005–1.030)
pH: 5 (ref 5.0–8.0)

## 2020-10-25 LAB — I-STAT VENOUS BLOOD GAS, ED
Acid-base deficit: 1 mmol/L (ref 0.0–2.0)
Bicarbonate: 24.9 mmol/L (ref 20.0–28.0)
Calcium, Ion: 1.18 mmol/L (ref 1.15–1.40)
HCT: 43 % (ref 36.0–46.0)
Hemoglobin: 14.6 g/dL (ref 12.0–15.0)
O2 Saturation: 47 %
Potassium: 4.3 mmol/L (ref 3.5–5.1)
Sodium: 138 mmol/L (ref 135–145)
TCO2: 26 mmol/L (ref 22–32)
pCO2, Ven: 44.6 mmHg (ref 44.0–60.0)
pH, Ven: 7.354 (ref 7.250–7.430)
pO2, Ven: 27 mmHg — CL (ref 32.0–45.0)

## 2020-10-25 LAB — I-STAT BETA HCG BLOOD, ED (MC, WL, AP ONLY): I-stat hCG, quantitative: <5 m[IU]/mL (ref <5)

## 2020-10-25 LAB — POCT URINALYSIS DIP (MANUAL ENTRY)
Bilirubin, UA: NEGATIVE
Glucose, UA: 500 mg/dL — AB
Leukocytes, UA: NEGATIVE
Nitrite, UA: NEGATIVE
Protein Ur, POC: NEGATIVE mg/dL
Spec Grav, UA: <=1.005 — AB (ref 1.010–1.025)
Urobilinogen, UA: 0.2 E.U./dL
pH, UA: 5 (ref 5.0–8.0)

## 2020-10-25 LAB — BASIC METABOLIC PANEL
Anion gap: 19 — ABNORMAL HIGH (ref 5–15)
BUN: 17 mg/dL (ref 6–20)
CO2: 20 mmol/L — ABNORMAL LOW (ref 22–32)
Calcium: 9.8 mg/dL (ref 8.9–10.3)
Chloride: 94 mmol/L — ABNORMAL LOW (ref 98–111)
Creatinine, Ser: 1.97 mg/dL — ABNORMAL HIGH (ref 0.44–1.00)
GFR, Estimated: 31 mL/min — ABNORMAL LOW (ref >60)
Glucose, Bld: 800 mg/dL (ref 70–99)
Potassium: 3.7 mmol/L (ref 3.5–5.1)
Sodium: 133 mmol/L — ABNORMAL LOW (ref 135–145)

## 2020-10-25 LAB — CBC
HCT: 41.7 % (ref 36.0–46.0)
Hemoglobin: 13.9 g/dL (ref 12.0–15.0)
MCH: 30.3 pg (ref 26.0–34.0)
MCHC: 33.3 g/dL (ref 30.0–36.0)
MCV: 90.8 fL (ref 80.0–100.0)
Platelets: 177 10*3/uL (ref 150–400)
RBC: 4.59 MIL/uL (ref 3.87–5.11)
RDW: 12.4 % (ref 11.5–15.5)
WBC: 13.9 10*3/uL — ABNORMAL HIGH (ref 4.0–10.5)
nRBC: 0 % (ref 0.0–0.2)

## 2020-10-25 LAB — CBG MONITORING, ED
Glucose-Capillary: >600 mg/dL (ref 70–99)
Glucose-Capillary: 520 mg/dL (ref 70–99)
Glucose-Capillary: 457 mg/dL — ABNORMAL HIGH (ref 70–99)
Glucose-Capillary: 373 mg/dL — ABNORMAL HIGH (ref 70–99)
Glucose-Capillary: 431 mg/dL — ABNORMAL HIGH (ref 70–99)

## 2020-10-25 LAB — RESP PANEL BY RT-PCR (FLU A&B, COVID) ARPGX2
Influenza A by PCR: NEGATIVE
Influenza B by PCR: NEGATIVE
SARS Coronavirus 2 by RT PCR: NEGATIVE

## 2020-10-25 LAB — POCT FASTING CBG KUC MANUAL ENTRY

## 2020-10-25 MED ORDER — POTASSIUM CHLORIDE 10 MEQ/100ML IV SOLN
10.0000 meq | INTRAVENOUS | Status: AC
  Administered 2020-10-25 – 2020-10-26 (×2): 10 meq via INTRAVENOUS

## 2020-10-25 MED ORDER — ENOXAPARIN SODIUM 40 MG/0.4ML ~~LOC~~ SOLN
40.0000 mg | Freq: Every day | SUBCUTANEOUS | Status: DC
  Administered 2020-10-25 – 2020-10-27 (×3): 40 mg via SUBCUTANEOUS
  Filled 2020-10-25 (×3): qty 0.4

## 2020-10-25 MED ORDER — ACETAMINOPHEN 650 MG RE SUPP: 650.0000 mg | Freq: Four times a day (QID) | RECTAL | Status: DC | PRN

## 2020-10-25 MED ORDER — DEXTROSE 50 % IV SOLN: 0.0000 mL | INTRAVENOUS | Status: DC | PRN

## 2020-10-25 MED ORDER — INSULIN REGULAR(HUMAN) IN NACL 100-0.9 UT/100ML-% IV SOLN
INTRAVENOUS | Status: DC
  Administered 2020-10-25: 22:00:00 11.5 [IU]/h via INTRAVENOUS
  Filled 2020-10-25: qty 100

## 2020-10-25 MED ORDER — POTASSIUM CHLORIDE 10 MEQ/100ML IV SOLN
10.0000 meq | INTRAVENOUS | Status: AC
  Administered 2020-10-25 (×2): 10 meq via INTRAVENOUS
  Filled 2020-10-25 (×2): qty 100

## 2020-10-25 MED ORDER — INSULIN REGULAR(HUMAN) IN NACL 100-0.9 UT/100ML-% IV SOLN
INTRAVENOUS | Status: DC
  Administered 2020-10-25: 23:00:00 12 [IU]/h via INTRAVENOUS
  Filled 2020-10-25: qty 100

## 2020-10-25 MED ORDER — LACTATED RINGERS IV BOLUS
20.0000 mL/kg | Freq: Once | INTRAVENOUS | Status: AC
  Administered 2020-10-25: 23:00:00 1906 mL via INTRAVENOUS

## 2020-10-25 MED ORDER — LACTATED RINGERS IV BOLUS
2000.0000 mL | Freq: Once | INTRAVENOUS | Status: AC
  Administered 2020-10-25: 21:00:00 2000 mL via INTRAVENOUS

## 2020-10-25 MED ORDER — ACETAMINOPHEN 325 MG PO TABS
650.0000 mg | ORAL_TABLET | Freq: Four times a day (QID) | ORAL | Status: DC | PRN
  Administered 2020-10-27: 650 mg via ORAL
  Filled 2020-10-25: qty 2

## 2020-10-25 MED ORDER — DEXTROSE IN LACTATED RINGERS 5 % IV SOLN: INTRAVENOUS | Status: DC

## 2020-10-25 MED ORDER — LACTATED RINGERS IV SOLN: INTRAVENOUS | Status: DC

## 2020-10-25 NOTE — ED Notes (Addendum)
Pt able to ambulate to the bathroom

## 2020-10-25 NOTE — ED Notes (Signed)
2 RNs attempted IV access unsuccessfully; IV team consult placed

## 2020-10-25 NOTE — ED Triage Notes (Signed)
Reports frequent urination and thirst since Wednesday.  States she went to [facility] and her sugar was elevated.  Reports history of gestational diabetes.

## 2020-10-25 NOTE — ED Provider Notes (Signed)
[facility]ERGENC[facility] DEPARTMENT Provider Note   CSN: [id number] Arrival date & time: 10/25/20  1513     History Chief Complaint  Patient presents with  . Hyperglycemia    Alexandra Walls is a 44 y.o. female.  Patient is a 44 year old female with a history of gestational diabetes and prior obesity who is presenting from urgent care today due to severe hyperglycemia.  Patient reports over the last 1 week or so she has had significant polyuria, polydipsia, general malaise and fatigue.  She denies any vomiting or abdominal pain.  She has intermittently had dizziness and had some blurred vision.  When she arrived at urgent care today her blood sugar read greater than 600 without prior history of diabetes.  She reports 12 years ago she had gestational diabetes but it resolved after having her child.  She takes no medications at this time.  She has not had any recent illness and denies any cough, congestion or shortness of breath.  The history is provided by the patient.  Hyperglycemia Blood sugar level PTA:  >600 Severity:  Severe      Past Medical History:  Diagnosis Date  . Gestational diabetes    12 years ago     Patient Active Problem List   Diagnosis Date Noted  . MIGRAINES, HX OF 12/21/2009  . OBESITY 12/02/2009  . HYPERTENSION 12/02/2009    Past Surgical History:  Procedure Laterality Date  . DILATION AND CURETTAGE OF UTERUS     x2     OB History   No obstetric history on file.     Family History  Problem Relation Age of Onset  . Hypertension Mother   . Diabetes Mother   . Diabetes Sister     Social History   Tobacco Use  . Smoking status: Never Smoker  . Smokeless tobacco: Never Used  Vaping Use  . Vaping Use: Never used  Substance Use Topics  . Alcohol use: Never  . Drug use: Never    Home Medications Prior to Admission medications   Not on File    Allergies    Penicillins  Review of Systems   Review of Systems   All other systems reviewed and are negative.   Physical Exam Updated Vital Signs BP (!) 163/103   Pulse (!) 107   Temp 98.5 F (36.9 C) (Oral)   Resp (!) 24   LMP 10/25/2020 (Exact Date)   SpO2 97%   Physical Exam Vitals and nursing note reviewed.  Constitutional:      General: She is not in acute distress.    Appearance: She is well-developed, normal weight and well-nourished.  HENT:     Head: Normocephalic and atraumatic.     Mouth/Throat:     Mouth: Mucous membranes are dry.  Eyes:     Extraocular Movements: EOM normal.     Pupils: Pupils are equal, round, and reactive to light.  Cardiovascular:     Rate and Rhythm: Regular rhythm. Tachycardia present.     Pulses: Normal pulses and intact distal pulses.     Heart sounds: Murmur heard.   Systolic murmur is present with a grade of 2/6. No friction rub.  Pulmonary:     Effort: Pulmonary effort is normal.     Breath sounds: Normal breath sounds. No wheezing or rales.  Abdominal:     General: Bowel sounds are normal. There is no distension.     Palpations: Abdomen is soft.  Tenderness: There is no abdominal tenderness. There is no guarding or rebound.  Musculoskeletal:        General: No tenderness. Normal range of motion.     Comments: No edema  Skin:    General: Skin is warm and dry.     Findings: No rash.  Neurological:     General: No focal deficit present.     Mental Status: She is alert and oriented to person, place, and time. Mental status is at baseline.     Cranial Nerves: No cranial nerve deficit.  Psychiatric:        Mood and Affect: Mood and affect and mood normal.        Behavior: Behavior normal.        Thought Content: Thought content normal.      ED Results / Procedures / Treatments   Labs (all labs ordered are listed, but only abnormal results are displayed) Labs Reviewed  BASIC METABOLIC PANEL - Abnormal; Notable for the following components:      Result Value   Sodium 133 (*)     Chloride 94 (*)    CO2 20 (*)    Glucose, Bld 800 (*)    Creatinine, Ser 1.97 (*)    GFR, Estimated 31 (*)    Anion gap 19 (*)    All other components within normal limits  CBC - Abnormal; Notable for the following components:   WBC 13.9 (*)    All other components within normal limits  URINALYSIS, ROUTINE W REFLEX MICROSCOPIC - Abnormal; Notable for the following components:   Color, Urine STRAW (*)    Specific Gravity, Urine 1.031 (*)    Glucose, UA >=500 (*)    Hgb urine dipstick LARGE (*)    Ketones, ur 20 (*)    All other components within normal limits  CBG MONITORING, ED  I-STAT BETA HCG BLOOD, ED ([facility], [facility], [facility] ONLY)  I-STAT VENOUS BLOOD GAS, ED    EKG None  Radiology No results found.  Procedures Procedures (including critical care time)  Medications Ordered in ED Medications  lactated ringers bolus 2,000 mL (has no administration in time range)    ED Course  I have reviewed the triage vital signs and the nursing notes.  Pertinent labs & imaging results that were available during my care of the patient were reviewed by me and considered in my medical decision making (see chart for details).    MDM Rules/Calculators/A&P                          Patient is a 44 year old female presenting today with new onset of diabetes.  Patient has typical hyperglycemia symptoms including polyuria, polydipsia but is not having abdominal pain or vomiting.  She is tachycardic here and labs show early onset of DKA.  Her pregnancy test is negative, UA with 20 ketones but no evidence of infection, abdomen is benign on exam.  BMP today shows a blood sugar of 800, anion gap of 19 and new AKI with creatinine of 1.97.  Potassium is normal at 3.7.  Mild leukocytosis of 13.9 and normal hemoglobin.  Patient given 2 L of LR.  VBG pending.  Patient will need to be placed on Endo tool and continued hyperglycemia reduction and closing the gap.  Findings discussed with the patient and the  plan.  8:40 PM Delay due to poor IV access.  VBG without signs of metabolic acidosis.  Will start  endotool after she recieves 2L  MDM Number of Diagnoses or Management Options   Amount and/or Complexity of Data Reviewed Clinical lab tests: ordered and reviewed Decide to obtain previous medical records or to obtain history from someone other than the patient: yes Obtain history from someone other than the patient: yes Review and summarize past medical records: yes Discuss the patient with other providers: yes Independent visualization of images, tracings, or specimens: yes  Risk of Complications, Morbidity, and/or Mortality Presenting problems: high Diagnostic procedures: moderate Management options: moderate  Patient Progress Patient progress: stable  CRITICAL CARE Performed by: Quavion Boule Total critical care time: 30 minutes Critical care time was exclusive of separately billable procedures and treating other patients. Critical care was necessary to treat or prevent imminent or life-threatening deterioration. Critical care was time spent personally by me on the following activities: development of treatment plan with patient and/or surrogate as well as nursing, discussions with consultants, evaluation of patient's response to treatment, examination of patient, obtaining history from patient or surrogate, ordering and performing treatments and interventions, ordering and review of laboratory studies, ordering and review of radiographic studies, pulse oximetry and re-evaluation of patient's condition.  Final Clinical Impression(s) / ED Diagnoses Final diagnoses:  Hyperglycemia  Diabetic ketoacidosis without coma associated with type 2 diabetes mellitus ([place])  AKI (acute kidney injury) [facility])    Rx / DC Orders ED Discharge Orders    None       Blanchie Dessert, MD 10/25/20 2041

## 2020-10-25 NOTE — ED Notes (Signed)
Date and time results received: 10/25/20 1405 (use smartphrase ".now" to insert current time)  Test: U/A & CBG Critical Value: Glucose on U/A is 537m/dL & CBG is greater than 500  Name of Provider Notified: Dr LJoseph Art Orders Received? Or Actions Taken?: Pt to go to ER via POV - see MD notes

## 2020-10-25 NOTE — ED Notes (Signed)
Dr. Melina Copa notified of glucose 800.

## 2020-10-25 NOTE — ED Provider Notes (Signed)
[facility]    CSN: [id number] Arrival date & time: 10/25/20  1319      History   Chief Complaint Chief Complaint  Patient presents with  . Dehydration    HPI Alexandra Walls is a 44 y.o. female.   This is the initial [place] urgent care visit for this 44 year old woman complaining of polyuria.  Patient had associated polydipsia polyuria and polyphagia.  She has had intermittent blurry vision.  She has had no abdominal pain.  She has a dry mouth and persistent thirst.  Patient has a history of gestational diabetes.     Past Medical History:  Diagnosis Date  . Gestational diabetes    12 years ago     Patient Active Problem List   Diagnosis Date Noted  . MIGRAINES, HX OF 12/21/2009  . OBESITY 12/02/2009  . HYPERTENSION 12/02/2009    Past Surgical History:  Procedure Laterality Date  . DILATION AND CURETTAGE OF UTERUS     x2    OB History   No obstetric history on file.      Home Medications    Prior to Admission medications   Not on File    Family History Family History  Problem Relation Age of Onset  . Hypertension Mother   . Diabetes Mother   . Diabetes Sister     Social History Social History   Tobacco Use  . Smoking status: Never Smoker  . Smokeless tobacco: Never Used  Vaping Use  . Vaping Use: Never used  Substance Use Topics  . Alcohol use: Never  . Drug use: Never     Allergies   Penicillins   Review of Systems Review of Systems  Constitutional: Positive for activity change.  Endocrine: Positive for polydipsia, polyphagia and polyuria.  Neurological: Positive for headaches.     Physical Exam Triage Vital Signs ED Triage Vitals [10/25/20 1357]  Enc Vitals Group     BP      Pulse      Resp      Temp      Temp src      SpO2      Weight 210 lb (95.3 kg)     Height 5' 6"  (1.676 m)     Head Circumference      Peak Flow      Pain Score      Pain Loc      Pain Edu?      Excl. in [place]?    No  data found.  Updated Vital Signs BP (!) 156/107 (BP Location: Left Arm)   Pulse (!) 118   Temp 98.2 F (36.8 C) (Oral)   Resp 15   Ht 5' 6"  (1.676 m)   Wt 95.3 kg   LMP 10/25/2020 (Exact Date)   SpO2 98%   BMI 33.89 kg/m    Physical Exam Vitals and nursing note reviewed.  Constitutional:      Appearance: Normal appearance. She is obese.  HENT:     Nose: Nose normal.     Mouth/Throat:     Mouth: Mucous membranes are dry.     Pharynx: Oropharynx is clear.  Cardiovascular:     Rate and Rhythm: Tachycardia present.     Heart sounds: Normal heart sounds.  Pulmonary:     Effort: Pulmonary effort is normal.     Breath sounds: Normal breath sounds.  Musculoskeletal:        General: Normal range of motion.  Cervical back: Normal range of motion and neck supple.  Skin:    General: Skin is warm and dry.  Neurological:     General: No focal deficit present.     Mental Status: She is alert.  Psychiatric:        Mood and Affect: Mood normal.        Behavior: Behavior normal.        Thought Content: Thought content normal.      UC Treatments / Results  Labs (all labs ordered are listed, but only abnormal results are displayed) Labs Reviewed  POCT URINALYSIS DIP (MANUAL ENTRY) - Abnormal; Notable for the following components:      Result Value   Glucose, UA =500 (*)    Ketones, POC UA small (15) (*)    Spec Grav, UA <=1.005 (*)    Blood, UA moderate (*)    All other components within normal limits  POCT FASTING CBG [place]    EKG   Radiology No results found.  Procedures Procedures (including critical care time)  Medications Ordered in UC Medications - No data to display  Initial Impression / Assessment and Plan / UC Course  I have reviewed the triage vital signs and the nursing notes.  Pertinent labs & imaging results that were available during my care of the patient were reviewed by me and considered in my medical decision making (see chart  for details).     Final Clinical Impressions(s) / UC Diagnoses   Final diagnoses:  New onset type 2 diabetes mellitus ([place])     Discharge Instructions     You have new onset diabetes and this blood sugar today of over 600 will require emergency treatment before pills can be started.    ED Prescriptions    None     I have reviewed the PDMP during this encounter.   Robyn Haber, MD 10/25/20 1415

## 2020-10-25 NOTE — ED Triage Notes (Signed)
Pt reports with polyuria. Pt reports increasing fluid intake as well. Reports 167/120 BP at home.

## 2020-10-25 NOTE — ED Notes (Signed)
Patient is being discharged from the Urgent Care and sent to the Emergency Department via POV w/ family . Per Dr Joseph Art, patient is in need of higher level of care due to higher level of care for BP & elevated CBG (greater than 600) Patient is aware and verbalizes understanding of plan of care.  Vitals:   10/25/20 1401  BP: (!) 156/107  Pulse: (!) 118  Resp: 15  Temp: 98.2 F (36.8 C)  SpO2: 98%

## 2020-10-25 NOTE — Discharge Instructions (Addendum)
You have new onset diabetes and this blood sugar today of over 600 will require emergency treatment before pills can be started.

## 2020-10-26 LAB — BASIC METABOLIC PANEL
Anion gap: 13 (ref 5–15)
Anion gap: 11 (ref 5–15)
Anion gap: 9 (ref 5–15)
Anion gap: 10 (ref 5–15)
BUN: 15 mg/dL (ref 6–20)
BUN: 15 mg/dL (ref 6–20)
BUN: 16 mg/dL (ref 6–20)
BUN: 18 mg/dL (ref 6–20)
CO2: 26 mmol/L (ref 22–32)
CO2: 25 mmol/L (ref 22–32)
CO2: 28 mmol/L (ref 22–32)
CO2: 23 mmol/L (ref 22–32)
Calcium: 9.8 mg/dL (ref 8.9–10.3)
Calcium: 9.8 mg/dL (ref 8.9–10.3)
Calcium: 9.5 mg/dL (ref 8.9–10.3)
Calcium: 9.3 mg/dL (ref 8.9–10.3)
Chloride: 107 mmol/L (ref 98–111)
Chloride: 107 mmol/L (ref 98–111)
Chloride: 111 mmol/L (ref 98–111)
Chloride: 108 mmol/L (ref 98–111)
Creatinine, Ser: 1.54 mg/dL — ABNORMAL HIGH (ref 0.44–1.00)
Creatinine, Ser: 1.36 mg/dL — ABNORMAL HIGH (ref 0.44–1.00)
Creatinine, Ser: 1.35 mg/dL — ABNORMAL HIGH (ref 0.44–1.00)
Creatinine, Ser: 1.5 mg/dL — ABNORMAL HIGH (ref 0.44–1.00)
GFR, Estimated: 42 mL/min — ABNORMAL LOW (ref >60)
GFR, Estimated: 49 mL/min — ABNORMAL LOW (ref >60)
GFR, Estimated: 49 mL/min — ABNORMAL LOW (ref >60)
GFR, Estimated: 44 mL/min — ABNORMAL LOW (ref >60)
Glucose, Bld: 198 mg/dL — ABNORMAL HIGH (ref 70–99)
Glucose, Bld: 218 mg/dL — ABNORMAL HIGH (ref 70–99)
Glucose, Bld: 212 mg/dL — ABNORMAL HIGH (ref 70–99)
Glucose, Bld: 398 mg/dL — ABNORMAL HIGH (ref 70–99)
Potassium: 3.6 mmol/L (ref 3.5–5.1)
Potassium: 3.4 mmol/L — ABNORMAL LOW (ref 3.5–5.1)
Potassium: 3.9 mmol/L (ref 3.5–5.1)
Potassium: 4 mmol/L (ref 3.5–5.1)
Sodium: 146 mmol/L — ABNORMAL HIGH (ref 135–145)
Sodium: 143 mmol/L (ref 135–145)
Sodium: 148 mmol/L — ABNORMAL HIGH (ref 135–145)
Sodium: 141 mmol/L (ref 135–145)

## 2020-10-26 LAB — BETA-HYDROXYBUTYRIC ACID
Beta-Hydroxybutyric Acid: 0.24 mmol/L (ref 0.05–0.27)
Beta-Hydroxybutyric Acid: 0.12 mmol/L (ref 0.05–0.27)
Beta-Hydroxybutyric Acid: 1.75 mmol/L — ABNORMAL HIGH (ref 0.05–0.27)

## 2020-10-26 LAB — CBG MONITORING, ED
Glucose-Capillary: 159 mg/dL — ABNORMAL HIGH (ref 70–99)
Glucose-Capillary: 176 mg/dL — ABNORMAL HIGH (ref 70–99)
Glucose-Capillary: 178 mg/dL — ABNORMAL HIGH (ref 70–99)
Glucose-Capillary: 185 mg/dL — ABNORMAL HIGH (ref 70–99)
Glucose-Capillary: 199 mg/dL — ABNORMAL HIGH (ref 70–99)
Glucose-Capillary: 218 mg/dL — ABNORMAL HIGH (ref 70–99)
Glucose-Capillary: 220 mg/dL — ABNORMAL HIGH (ref 70–99)
Glucose-Capillary: 314 mg/dL — ABNORMAL HIGH (ref 70–99)
Glucose-Capillary: 338 mg/dL — ABNORMAL HIGH (ref 70–99)
Glucose-Capillary: 398 mg/dL — ABNORMAL HIGH (ref 70–99)

## 2020-10-26 LAB — GLUCOSE, CAPILLARY
Glucose-Capillary: 313 mg/dL — ABNORMAL HIGH (ref 70–99)
Glucose-Capillary: 260 mg/dL — ABNORMAL HIGH (ref 70–99)

## 2020-10-26 LAB — HEMOGLOBIN A1C
Hgb A1c MFr Bld: 9.8 % — ABNORMAL HIGH (ref 4.8–5.6)
Mean Plasma Glucose: 234.56 mg/dL

## 2020-10-26 LAB — HIV ANTIBODY (ROUTINE TESTING W REFLEX): HIV Screen 4th Generation wRfx: NONREACTIVE

## 2020-10-26 MED ORDER — POTASSIUM CHLORIDE CRYS ER 20 MEQ PO TBCR
40.0000 meq | EXTENDED_RELEASE_TABLET | Freq: Two times a day (BID) | ORAL | Status: AC
  Administered 2020-10-26 (×2): 40 meq via ORAL
  Filled 2020-10-26 (×2): qty 2

## 2020-10-26 MED ORDER — INSULIN GLARGINE 100 UNIT/ML ~~LOC~~ SOLN
19.0000 [IU] | SUBCUTANEOUS | Status: DC
  Administered 2020-10-26: 12:00:00 19 [IU] via SUBCUTANEOUS
  Filled 2020-10-26 (×2): qty 0.19

## 2020-10-26 MED ORDER — LIVING WELL WITH DIABETES BOOK
Freq: Once | Status: DC
  Filled 2020-10-26: qty 1

## 2020-10-26 MED ORDER — INFLUENZA VAC SPLIT QUAD 0.5 ML IM SUSY: 0.5000 mL | PREFILLED_SYRINGE | INTRAMUSCULAR | Status: DC

## 2020-10-26 MED ORDER — INSULIN ASPART 100 UNIT/ML ~~LOC~~ SOLN
0.0000 [IU] | SUBCUTANEOUS | Status: DC
  Administered 2020-10-26: 8 [IU] via SUBCUTANEOUS
  Administered 2020-10-27 (×2): 15 [IU] via SUBCUTANEOUS
  Administered 2020-10-27: 11 [IU] via SUBCUTANEOUS
  Administered 2020-10-27: 5 [IU] via SUBCUTANEOUS
  Administered 2020-10-27: 8 [IU] via SUBCUTANEOUS

## 2020-10-26 MED ORDER — INSULIN ASPART 100 UNIT/ML ~~LOC~~ SOLN
0.0000 [IU] | SUBCUTANEOUS | Status: DC
  Administered 2020-10-26: 7 [IU] via SUBCUTANEOUS
  Administered 2020-10-26: 15:00:00 9 [IU] via SUBCUTANEOUS
  Administered 2020-10-26: 14:00:00 7 [IU] via SUBCUTANEOUS

## 2020-10-26 NOTE — Progress Notes (Signed)
Pt arrived on the unit alert with clothes, shoes and cell phone and charger. Pt was able to walk from stretcher to bed. Also from the chair to bathroom.

## 2020-10-26 NOTE — ED Notes (Signed)
Hospital bed requested for pt. 

## 2020-10-26 NOTE — Progress Notes (Signed)
Inpatient Diabetes Program Recommendations  AACE/ADA: New Consensus Statement on Inpatient Glycemic Control (2015)  Target Ranges:  Prepandial:   less than 140 mg/dL      Peak postprandial:   less than 180 mg/dL (1-2 hours)      Critically ill patients:  140 - 180 mg/dL   Lab Results  Component Value Date   GLUCAP 338 (H) 10/26/2020   HGBA1C 9.8 (H) 10/26/2020    Review of Glycemic Control Results for [name], Alexandra Walls (MRN [id number]) as of 10/26/2020 14:38  Ref. Range 10/26/2020 06:03 10/26/2020 08:16 10/26/2020 09:52 10/26/2020 11:35 10/26/2020 13:43  Glucose-Capillary Latest Ref Range: 70 - 99 mg/dL 220 (H) 178 (H) 199 (H) 159 (H) 338 (H)   Diabetes history:  GDM 12 years ago New onset DM2 Current orders for Inpatient glycemic control:  Lantus 19 units daily Novolog 0-9 units TID  Note:  Referral for DM management.  Spoke with patient at bedside in ED.  She states she had increased thirst and increased urination over the last couple of weeks.  She has not noticed weight loss.   Spoke with pt about new diagnosis. Discussed A1C results with her and explained what an A1C is, basic pathophysiology of DM Type 2, basic home care, basic diabetes diet nutrition principles, importance of checking CBGs and maintaining good CBG control to prevent long-term and short-term complications. Reviewed signs and symptoms of hyperglycemia and hypoglycemia and how to treat hypoglycemia at home. Also reviewed blood sugar goals at home.  RNs to provide ongoing basic DM education at bedside with this patient. Have ordered educational booklet, and DM education. Have also placed RD consult for DM diet education for this patient. She states she drinks a lot of soda and beverages with sugar.  She also eats a lot of CHO's.  12 years ago she had GDM and followed a strict GDM diet.  She is aware of CHO's.  We discussed The Plate Method and importance of exercise to assist with glucose control.  She states she would  prefer not to start on insulin and would like to try diet modifications and oral medications.  Explained The DM team will follow her closely during inpatient admission and assist with making recommendations.  If insulin is required out team will follow up with her and teach insulin administration.    Please use each patient interaction to provide diabetes education. Please review Living Well with Diabetes booklet with the patient, have patient watch patient education videos on diabetes, and instruct on insulin administration. Please allow patient to be actively engaged with diabetes management by allowing patient to check own glucose and self-administer insulin injections. Diabetes Coordinator will follow up with patient and reinforce diabetes education.   Will continue to follow while inpatient.  Thank you, Reche Dixon, RN, BSN Diabetes Coordinator Inpatient Diabetes Program [phone] (team pager from 8a-5p)

## 2020-10-26 NOTE — ED Notes (Signed)
Pt placed on hospital bed

## 2020-10-26 NOTE — H&P (Signed)
Date: 10/26/2020               Patient Name:  Alexandra Walls MRN: [id number]  DOB: 1976/04/14 Age / Sex: 44 y.o., female   PCP: Parke Simmers., MD         Medical Service: Internal Medicine Teaching Service         Attending Physician: Dr. Aldine Contes, MD    First Contact: Dr. Virl Axe Pager: [phone]  Second Contact: Dr. Maudie Mercury Pager: [phone]       After Hours (After 5p/  First Contact Pager: [phone]  weekends / holidays): Second Contact Pager: [phone]   Chief Complaint: DKA  History of Present Illness:  Patient is a 44 year old female with past medical history of gestational diabetes who presents the ED for blood sugar of 600s.  Patient came to [facility] urgent care and was told to go to the ED.  Patient states that she has been having polyuria, polydipsia, fatigue and blurry vision in the last week.  She denies chest pain, shortness of breath, fever, cough, nausea, vomiting, abdominal pain, dysuria, constipation, diarrhea or headache.  Also denies any sick contacts.  Patient last saw her PCP a few years ago and is not taking any medication.  She goes to the a practice on [address] to get a Depo shot every 3 months.  Patient could not remember the name of the practice of her PCP.   In the ED, CBC shows leukocytosis of 13.9. BMP shows anion gap metabolic acidosis with blood sugar of 800.  UA showed hematuria and ketonuria.  VBG shows normal pH of 7.35.  She received 2 L of LR and was started on Endo tool.  Meds:  Current Meds  Medication Sig  . APPLE CIDER VINEGAR PO Take 1-2 tablets by mouth daily as needed (blood pressure).  . medroxyPROGESTERone (DEPO-PROVERA) 150 MG/ML injection Inject 150 mg into the muscle every 3 (three) months.  . Multiple Vitamins-Minerals (MULTIVITAMIN ADULT) CHEW Chew 1 tablet by mouth daily.     Allergies: Allergies as of 10/25/2020 - Review Complete 10/25/2020  Allergen Reaction Noted  . Penicillins   12/02/2009   Past Medical History:  Diagnosis Date  . Gestational diabetes    12 years ago     Family History:  Mom and sister: Diabetes Father: Lung cancer  Social History:  Drink alcohol socially Denies smoking or recreational drug use  Review of Systems: A complete ROS was negative except as per HPI.   Physical Exam: Blood pressure (!) 167/99, pulse (!) 107, temperature 98.5 F (36.9 C), temperature source Oral, resp. rate (!) 22, last menstrual period 10/25/2020, SpO2 98 %.  Physical Exam Constitutional:      General: She is not in acute distress. HENT:     Head: Normocephalic.  Eyes:     General:        Right eye: No discharge.        Left eye: No discharge.  Cardiovascular:     Rate and Rhythm: Regular rhythm. Tachycardia present.     Heart sounds: Murmur (3/6 systolic murmur heard best at apex. No change intensity with Valsalva maneuver) heard.    Pulmonary:     Effort: No respiratory distress.     Breath sounds: Normal breath sounds.  Abdominal:     General: Bowel sounds are normal.     Tenderness: There is no abdominal tenderness.  Musculoskeletal:     Right lower leg: No edema.  Left lower leg: No edema.  Skin:    General: Skin is warm.     Coloration: Skin is not jaundiced.  Neurological:     Mental Status: She is alert.  Psychiatric:        Mood and Affect: Mood normal.     Assessment & Plan by Problem: Active Problems:   DKA (diabetic ketoacidosis) ([place])   Diabetic ketoacidosis Patient have history of gestational diabetes, who comes in for blood sugar in the 600s.  BMP shows anion gap metabolic acidosis consistent with DKA.  UA shows ketonuria.  VBG however shows normal pH.  Patient was started on Endo tool with IV fluid resuscitation. -BMP every 4 hours -Continue insulin drip until anion gap closes x2  -LR 125 cc/h.   -Pending beta hydroxybutyrate acid -Check hemoglobin A1c -N.p.o.   Acute kidney injury Unknown baseline.  Last  BMP was in 2011.  This is likely secondary to dehydration from polyuria.  Continue IV fluid -LR 125 cc/h -BMP every 4 hours   Tachycardia Patient heart rate has been in the 110s since admission.  This could be related to dehydration.  Her O2 sats are high 90s on room air.  No signs of infection with no fever.  Patient appears comfortable on exam. -Obtain EKG and continue IV fluid.   Heart murmur Patient reports history of heart murmur that was diagnosed when she was little.  She has not had any problems with her heart.  States that she was told that she need antibiotics before any procedure.  This could be mitral valve prolapse.  Because patient has not have any issue for many years, can consider obtaining an echocardiogram outpatient for evaluation.   Leukocytosis WBC 13.9.  Patient has no signs and symptoms of infection with no fever.  Covid test negative.  Likely reactive -CBC in a.m.   Hematuria UA show large Hgb. Patient is on her menstrual cycle. Denies dysuria or flank pain.   Dispo: Admit patient to Inpatient with expected length of stay greater than 2 midnights.  Signed: Gaylan Gerold, DO 10/26/2020, 12:07 AM  Pager: [phone] After 5pm on weekdays and 1pm on weekends: On Call pager: [phone]

## 2020-10-26 NOTE — ED Notes (Signed)
MD Alfonse Spruce returned page, advised that if next BMP & BHA WNL, endotool will be DC

## 2020-10-26 NOTE — ED Notes (Signed)
MD Alfonse Spruce paged for DC endotool orders

## 2020-10-26 NOTE — Progress Notes (Addendum)
   Subjective:   During evaluation this morning, patient sleeping at first but easily arousable. She states she is tired, notes that she was finally able to get some rest when we arrived at bedside. States she has not had to urinate as much here in the hospital as she did before arrival. Reports she has only had increased hunger today. Denies abdominal pain, fevers/chills, headache, chest pain, shortness of breath.  Reports she recently was assigned a PCP, last name Landry Mellow (on [employer]), but she hasn't seen her yet. States it has been a while since she last saw a doctor.   Reviewed the current plan, and the patient expressed understanding  Objective:  Vital signs in last 24 hours: Vitals:   10/26/20 0800 10/26/20 0830 10/26/20 0900 10/26/20 0930  BP: (!) 152/92 (!) 144/87 (!) 140/92 (!) 143/91  Pulse: 100 100 94 93  Resp: 18 11 20  (!) 23  Temp:      TempSrc:      SpO2: 98% 98% 99% 98%   Physical Exam: General: obese female, lying in bed, NAD. CV: normal rate and regular rhythm, + systolic murmur. Pulm: CTABL Abdomen: soft, nontender, nondistended. +BS. Neuro: AAOx3  Assessment/Plan:  Active Problems:   DKA (diabetic ketoacidosis) ([place])  Ms. Alexandra Walls is a 44 yo female with no significant PMH presenting with severe hyperglycemia, anion gap metabolic acidosis, and ketonuria, consistent with DKA from new onset T2DM.  Diabetic ketoacidosis 2/2 new onset T2DM Patient with history of gestational diabetes, coming in for blood glucose levels in the 600s. Initial BMP showed anion gap metabolic acidosis and urinalysis showed ketonuria, both consistent with DKA. VBG showed normal pH, so patient was acidotic but not acidemic, likely suggesting early stage of DKA. Patient was started on Endotool along with IV fluid resuscitation. BMP x2 showing closed anion gap so will transition off Endotool to subcutaneous insulin (lantus 19 units daily) and sensitive SSI with meals. Started on carb  modified diet. -BMP q4h, replete electrolytes as needed -LR @125  cc/hr (switch to D5 in LR if CBG <250 mg/dL) -advanced to carb modified diet -beta-hydroxybutyric acid wnl -HbA1c 9.8 -subcutaneous insulin (lantus 19 units daily) -sensitive SSI -K 3.4 this AM despite 4 runs of IV KCl last night. Since patient can now have meals, will give PO KCl 86mq x2 doses -consult diabetic coordinator to help patient adjust to new long term medications she will need to take for new onset DM  Acute kidney injury Unknown baseline. AKI likely 2/2 dehydration. On admission, Cr 1.97. Continue IV fluid resuscitation. Creatinine trending down nicely with fluids. -LR @125  cc/hr -BMP q4h -Cr 1.97 > 1.36  Tachycardia Patient's HR in the 110s on admission, likely 2/2 dehydration. HR has come down to 90s with fluid resuscitation. Mild reactive leukocytosis, but afebrile with no signs of infection and O2 sats >94%. -EKG pending -continue IV fluids  Heart Murmur Diagnosed when she was little. Asymptomatic, never had any cardiac problems. Can obtain ECHO as outpatient.  Hematuria UA showing large Hgb and RBCs, however patient is on her menstrual cycle. No dysuria or flank pain.   Prior to Admission Living Arrangement: Home Anticipated Discharge Location: TBD Barriers to Discharge: continued medical management Dispo: Anticipated discharge in approximately 2-3 day(s).   JVirl Axe MD 10/26/2020, 10:18 AM Pager: [phone] 5pm on weekdays and 1pm on weekends: On Call pager [phone]

## 2020-10-27 DIAGNOSIS — E1110 Type 2 diabetes mellitus with ketoacidosis without coma: Principal | ICD-10-CM

## 2020-10-27 DIAGNOSIS — N179 Acute kidney failure, unspecified: Secondary | ICD-10-CM | POA: Diagnosis present

## 2020-10-27 LAB — GLUCOSE, CAPILLARY
Glucose-Capillary: 240 mg/dL — ABNORMAL HIGH (ref 70–99)
Glucose-Capillary: 273 mg/dL — ABNORMAL HIGH (ref 70–99)
Glucose-Capillary: 327 mg/dL — ABNORMAL HIGH (ref 70–99)
Glucose-Capillary: 356 mg/dL — ABNORMAL HIGH (ref 70–99)
Glucose-Capillary: 373 mg/dL — ABNORMAL HIGH (ref 70–99)
Glucose-Capillary: 422 mg/dL — ABNORMAL HIGH (ref 70–99)
Glucose-Capillary: 435 mg/dL — ABNORMAL HIGH (ref 70–99)

## 2020-10-27 LAB — BASIC METABOLIC PANEL
Anion gap: 10 (ref 5–15)
Anion gap: 10 (ref 5–15)
BUN: 18 mg/dL (ref 6–20)
BUN: 17 mg/dL (ref 6–20)
CO2: 21 mmol/L — ABNORMAL LOW (ref 22–32)
CO2: 20 mmol/L — ABNORMAL LOW (ref 22–32)
Calcium: 8.9 mg/dL (ref 8.9–10.3)
Calcium: 9 mg/dL (ref 8.9–10.3)
Chloride: 105 mmol/L (ref 98–111)
Chloride: 102 mmol/L (ref 98–111)
Creatinine, Ser: 1.39 mg/dL — ABNORMAL HIGH (ref 0.44–1.00)
Creatinine, Ser: 1.39 mg/dL — ABNORMAL HIGH (ref 0.44–1.00)
GFR, Estimated: 48 mL/min — ABNORMAL LOW (ref >60)
GFR, Estimated: 48 mL/min — ABNORMAL LOW (ref >60)
Glucose, Bld: 283 mg/dL — ABNORMAL HIGH (ref 70–99)
Glucose, Bld: 445 mg/dL — ABNORMAL HIGH (ref 70–99)
Potassium: 3.6 mmol/L (ref 3.5–5.1)
Potassium: 4.2 mmol/L (ref 3.5–5.1)
Sodium: 136 mmol/L (ref 135–145)
Sodium: 132 mmol/L — ABNORMAL LOW (ref 135–145)

## 2020-10-27 MED ORDER — POTASSIUM CHLORIDE CRYS ER 20 MEQ PO TBCR
40.0000 meq | EXTENDED_RELEASE_TABLET | Freq: Two times a day (BID) | ORAL | Status: AC
  Administered 2020-10-27 (×2): 40 meq via ORAL
  Filled 2020-10-27 (×2): qty 2

## 2020-10-27 MED ORDER — INSULIN GLARGINE 100 UNIT/ML ~~LOC~~ SOLN
22.0000 [IU] | SUBCUTANEOUS | Status: DC
  Administered 2020-10-27: 22 [IU] via SUBCUTANEOUS
  Filled 2020-10-27: qty 0.22

## 2020-10-27 MED ORDER — INSULIN ASPART 100 UNIT/ML ~~LOC~~ SOLN
25.0000 [IU] | Freq: Once | SUBCUTANEOUS | Status: AC
  Administered 2020-10-27: 25 [IU] via SUBCUTANEOUS

## 2020-10-27 MED ORDER — INSULIN GLARGINE 100 UNIT/ML ~~LOC~~ SOLN
20.0000 [IU] | Freq: Two times a day (BID) | SUBCUTANEOUS | Status: DC
  Administered 2020-10-27: 20 [IU] via SUBCUTANEOUS
  Filled 2020-10-27 (×3): qty 0.2

## 2020-10-27 MED ORDER — INSULIN ASPART 100 UNIT/ML ~~LOC~~ SOLN
0.0000 [IU] | SUBCUTANEOUS | Status: DC
  Administered 2020-10-28: 15 [IU] via SUBCUTANEOUS
  Administered 2020-10-28: 7 [IU] via SUBCUTANEOUS
  Administered 2020-10-28 (×2): 20 [IU] via SUBCUTANEOUS
  Administered 2020-10-28: 7 [IU] via SUBCUTANEOUS
  Administered 2020-10-29: 11 [IU] via SUBCUTANEOUS

## 2020-10-27 NOTE — Progress Notes (Addendum)
Inpatient Diabetes Program Recommendations  AACE/ADA: New Consensus Statement on Inpatient Glycemic Control (2015)  Target Ranges:  Prepandial:   less than 140 mg/dL      Peak postprandial:   less than 180 mg/dL (1-2 hours)      Critically ill patients:  140 - 180 mg/dL   Lab Results  Component Value Date   GLUCAP 240 (H) 10/27/2020   HGBA1C 9.8 (H) 10/26/2020    Review of Glycemic Control Results for STAYCE, DELANCY (MRN [id number]) as of 10/27/2020 10:59  Ref. Range 10/26/2020 20:43 10/27/2020 03:17 10/27/2020 07:30  Glucose-Capillary Latest Ref Range: 70 - 99 mg/dL 260 (H) 273 (H) 240 (H)    Diabetes history:  GDM 12 years ago New onset DM2 Current orders for Inpatient glycemic control:  Lantus 22 units daily Novolog 0-15 units Q4H  Inpatient Diabetes Program Recommendations:    Consider increasing Lantus to 32 units QD.   In preparation for discharge would anticipate patient to need insulin. -Blood glucose meter (inlcudes lancets and strips) (#[id number]) -If patient to be discharged on insulin: Levemir preferred: #[id number] Will plan to follow up with patient.   Addendum: Spoke with patient again to review concepts discussed yesterday.  Given insulin needs while inpatient anticipate need for insulin at discharge. Patient is open to insulin if needed. Encouraged patient to begin learning self injection. Reviewed current insulin needs, trends, increased dose, preferred med with insurance, hypo vs hyper glycemia, interventions, endocrinology list, need for monitoring/frequency, and Freestyle Libre. Patient is interested in [place]; MD provided order.  Educated patient on insulin pen use at home. Reviewed contents of insulin flexpen starter kit. Reviewed all steps if insulin pen including attachment of needle, 2-unit air shot, dialing up dose, giving injection, removing needle, disposal of sharps, storage of unused insulin, disposal of insulin etc. Patient able to provide  successful return demonstration. Also reviewed troubleshooting with insulin pen. MD to give patient Rxs for insulin pens and insulin pen needles.   Thanks, Bronson Curb, MSN, RNC-OB Diabetes Coordinator [phone] (8a-5p)

## 2020-10-27 NOTE — Plan of Care (Signed)
  Problem: Education: Goal: Knowledge of General Education information will improve Description: Including pain rating scale, medication(s)/side effects and non-pharmacologic comfort measures Outcome: Progressing   Problem: Clinical Measurements: Goal: Ability to maintain clinical measurements within normal limits will improve Outcome: Progressing Goal: Will remain free from infection Outcome: Progressing Goal: Diagnostic test results will improve Outcome: Progressing Goal: Cardiovascular complication will be avoided Outcome: Progressing   Problem: Activity: Goal: Risk for activity intolerance will decrease Outcome: Progressing   Problem: Elimination: Goal: Will not experience complications related to bowel motility Outcome: Progressing Goal: Will not experience complications related to urinary retention Outcome: Progressing   Problem: Pain Managment: Goal: General experience of comfort will improve Outcome: Progressing   Problem: Safety: Goal: Ability to remain free from injury will improve Outcome: Progressing   Problem: Skin Integrity: Goal: Risk for impaired skin integrity will decrease Outcome: Progressing

## 2020-10-27 NOTE — Progress Notes (Signed)
   Subjective:   Patient evaluated at bedside this morning. States she is feeling well ambulating from bed to chair yesterday. No acute complaints. Continue to discuss diabetes education and glucose control with her. She is familiar with checking CBGs as she has done it before  When she had gestational diabetes.  She is agreeable to continue working with diabetes coordinator and learning to administer her own insulin.  Objective:   Vital signs in last 24 hours: Vitals:   10/26/20 1550 10/26/20 1910 10/26/20 2319 10/27/20 0328  BP: (!) 148/91 (!) 143/108 (!) 143/97 131/82  Pulse: (!) 112 (!) 109 (!) 102   Resp: 18 20 20    Temp: 99.1 F (37.3 C) 99.2 F (37.3 C) 99.6 F (37.6 C) 99.5 F (37.5 C)  TempSrc: Oral   Oral  SpO2: 99% 100% 98% 98%  Weight: 95.3 kg     Height: 5' 6"  (1.676 m)      BMP Latest Ref Rng & Units 10/27/2020 10/26/2020 10/26/2020  Glucose 70 - 99 mg/dL 283(H) 398(H) 212(H)  BUN 6 - 20 mg/dL 18 18 16   Creatinine 0.44 - 1.00 mg/dL 1.39(H) 1.50(H) 1.35(H)  Sodium 135 - 145 mmol/L 136 141 148(H)  Potassium 3.5 - 5.1 mmol/L 3.6 4.0 3.9  Chloride 98 - 111 mmol/L 105 108 111  CO2 22 - 32 mmol/L 21(L) 23 28  Calcium 8.9 - 10.3 mg/dL 8.9 9.3 9.5   Physical Exam: General: obese female, lying in bed, NAD. CV: normal rate and regular rhythm, + systolic murmur. Pulm: CTABL Abdomen: soft, nontender, nondistended. +BS. Neuro: AAOx3  Assessment/Plan:  Principal Problem:   DKA (diabetic ketoacidosis) ([place])  Ms. Brereton is a 44 yo female with no significant PMH presenting with severe hyperglycemia, anion gap metabolic acidosis, and ketonuria, consistent with DKA from new onset T2DM.  Diabetic ketoacidosis 2/2 new onset T2DM This morning patient with elevated glucoses in high 300s on subcutaneous insulin lantus 19 units daily. Will increase her basal insulin to 20 units twice daily given elevated glucose. Will need diabetic education on administering insulin before  discharge. If glucose better controlled will likely discharge on basal insulin and metformin with close follow up with PCP to adjust medications. -Lantus 20 units twice daily -sensitive SSI -K 3.6, will replete -consult diabetic coordinator to help patient learn to adminster to new medications   Acute kidney injury Improved to 1.4 today. Appears Cr.prior to this was around 1. Will continue to monitor kidney function. Persitently elevated Cr. May indicate chronic kidney injury likely due to uncontrolled diabetes -monitor bmp  Prior to Admission Living Arrangement: Home Anticipated Discharge Location: Home Barriers to Discharge: continued medical management Dispo: Anticipated discharge in approximately 1 day(s).   Iona Beard, MD 10/27/2020, 6:26 AM Pager: [phone] After 5pm on weekdays and 1pm on weekends: On Call pager [phone]

## 2020-10-27 NOTE — Plan of Care (Signed)
  RD consulted for nutrition education regarding diabetes.   Lab Results  Component Value Date   HGBA1C 9.8 (H) 10/26/2020    RD provided "Carbohydrate Counting for People with Diabetes" handout from the Academy of [employer] Dietetics. Discussed different food groups and their effects on blood sugar, emphasizing carbohydrate-containing foods. Provided list of carbohydrates and recommended serving sizes of common foods.  Discussed importance of controlled and consistent carbohydrate intake throughout the day. Provided examples of ways to balance meals/snacks and encouraged intake of high-fiber, whole grain complex carbohydrates. Teach back method used.  Patient endorses eating two meals daily as she has an active job. States she typically eats a hot pocket for lunch or something quick and fast food (chic-fil-a sandwich with fries) for dinner. Drinks most soda, water, and lemonade. Discussed the importance of eating three meals, how to make meals balanced, which foods contain carbohydrates, how to read a food label, and how to make appropriate beverage substitutions.   Labs and medications reviewed. No further nutrition interventions warranted at this time. RD contact information provided. If additional nutrition issues arise, please re-consult RD.  Alexandra Walls RD, LDN Clinical Nutrition Pager listed in [place]

## 2020-10-27 NOTE — Discharge Instructions (Signed)
Local Endocrinologists [facility] [phone]) 1. Dr. Philemon Kingdom 2. Dr. Janie Morning [facility] [phone]) 1. Dr. Delrae Rend [facility] [phone]) 1. Dr. Jacelyn Pi 2. Dr. Anda Kraft [facility] [phone]) 1. Dr. Daneil Dolin [facility] [phone]) [[place] office]  [phone]) [[place] office] 1. Dr. Lenna Sciara Solum 2. Dr. Mee Hives [facility] [phone]) 1. Autumn Hudnall Ronnald Ramp), PA 2. Dr. Amalia Greenhouse 3. Dr. Marsh Dolly. [facility] [phone]) 1. Dr. Glade Lloyd [facility] [phone]) 1. Dr. Orville Govern 2. Dr. Lelon Huh 3. Dr. Jerelene Redden 4. Alwyn Ren, FNP Dr. Carolynn Serve. [place] [facility] [place] [phone])  Hypoglycemia Hypoglycemia is when the sugar (glucose) level in your blood is too low. Signs of low blood sugar may include:  Feeling: ? Hungry. ? Worried or nervous (anxious). ? Sweaty and clammy. ? Confused. ? Dizzy. ? Sleepy. ? Sick to your stomach (nauseous).  Having: ? A fast heartbeat. ? A headache. ? A change in your vision. ? Tingling or no feeling (numbness) around your mouth, lips, or tongue. ? Jerky movements that you cannot control (seizure).  Having trouble with: ? Moving (coordination). ? Sleeping. ? Passing out (fainting). ? Getting upset easily (irritability). Low blood sugar can happen to people who have diabetes and people who do not have diabetes. Low blood sugar can happen quickly, and it can be an emergency. Treating low blood sugar Low blood sugar is often treated by eating or drinking something sugary right away, such as:  Fruit juice, 4-6 oz (120-150 mL).  Regular soda (not diet soda), 4-6 oz (120-150 mL).  Low-fat milk, 4 oz (120 mL).  Several pieces of hard candy.  Sugar or honey, 1 Tbsp (15  mL). Treating low blood sugar if you have diabetes If you can think clearly and swallow safely, follow the 15:15 rule:  Take 15 grams of a fast-acting carb (carbohydrate). Talk with your doctor about how much you should take.  Always keep a source of fast-acting carb with you, such as: ? Sugar tablets (glucose pills). Take 3-4 pills. ? 6-8 pieces of hard candy. ? 4-6 oz (120-150 mL) of fruit juice. ? 4-6 oz (120-150 mL) of regular (not diet) soda. ? 1 Tbsp (15 mL) honey or sugar.  Check your blood sugar 15 minutes after you take the carb.  If your blood sugar is still at or below 70 mg/dL (3.9 mmol/L), take 15 grams of a carb again.  If your blood sugar does not go above 70 mg/dL (3.9 mmol/L) after 3 tries, get help right away.  After your blood sugar goes back to normal, eat a meal or a snack within 1 hour.  Treating very low blood sugar If your blood sugar is at or below 54 mg/dL (3 mmol/L), you have very low blood sugar (severe hypoglycemia). This may also cause:  Passing out.  Jerky movements you cannot control (seizure).  Losing consciousness (coma). This is an emergency. Do not wait to see if the symptoms will go away. Get medical help right away. Call your local emergency services (911 in the [place] Do not drive yourself to the hospital. If you have very low blood sugar and you cannot eat or drink, you may need a glucagon shot (injection). A family member or friend should learn how to check your blood sugar and how to give you a glucagon shot. Ask your doctor if you need to have a glucagon  shot kit at home. Follow these instructions at home: General instructions  Take over-the-counter and prescription medicines only as told by your doctor.  Stay aware of your blood sugar as told by your doctor.  Limit alcohol intake to no more than 1 drink a day for nonpregnant women and 2 drinks a day for men. One drink equals 12 oz of beer (355 mL), 5 oz of wine (148 mL), or 1 oz of  hard liquor (44 mL).  Keep all follow-up visits as told by your doctor. This is important. If you have diabetes:   Follow your diabetes care plan as told by your doctor. Make sure you: ? Know the signs of low blood sugar. ? Take your medicines as told. ? Follow your exercise and meal plan. ? Eat on time. Do not skip meals. ? Check your blood sugar as often as told by your doctor. Always check it before and after exercise. ? Follow your sick day plan when you cannot eat or drink normally. Make this plan ahead of time with your doctor.  Share your diabetes care plan with: ? Your work or school. ? People you live with.  Check your pee (urine) for ketones: ? When you are sick. ? As told by your doctor.  Carry a card or wear jewelry that says you have diabetes. Contact a doctor if:  You have trouble keeping your blood sugar in your target range.  You have low blood sugar often. Get help right away if:  You still have symptoms after you eat or drink something sugary.  Your blood sugar is at or below 54 mg/dL (3 mmol/L).  You have jerky movements that you cannot control.  You pass out. These symptoms may be an emergency. Do not wait to see if the symptoms will go away. Get medical help right away. Call your local emergency services (911 in the [place] Do not drive yourself to the hospital. Summary  Hypoglycemia happens when the level of sugar (glucose) in your blood is too low.  Low blood sugar can happen to people who have diabetes and people who do not have diabetes. Low blood sugar can happen quickly, and it can be an emergency.  Make sure you know the signs of low blood sugar and know how to treat it.  Always keep a source of sugar (fast-acting carb) with you to treat low blood sugar. This information is not intended to replace advice given to you by your health care provider. Make sure you discuss any questions you have with your health care provider. Document Revised:  02/07/2019 Document Reviewed: 11/20/2015 Elsevier Patient Education  [place]. Hyperglycemia Hyperglycemia occurs when the level of sugar (glucose) in the blood is too high. Glucose is a type of sugar that provides the body's main source of energy. Certain hormones (insulin and glucagon) control the level of glucose in the blood. Insulin lowers blood glucose, and glucagon increases blood glucose. Hyperglycemia can result from having too little insulin in the bloodstream, or from the body not responding normally to insulin. Hyperglycemia occurs most often in people who have diabetes (diabetes mellitus), but it can happen in people who do not have diabetes. It can develop quickly, and it can be life-threatening if it causes you to become severely dehydrated (diabetic ketoacidosis or hyperglycemic hyperosmolar state). Severe hyperglycemia is a medical emergency. What are the causes? If you have diabetes, hyperglycemia may be caused by:  Diabetes medicine.  Medicines that increase blood glucose or  affect your diabetes control.  Not eating enough, or not eating often enough.  Changes in physical activity level.  Being sick or having an infection. If you have prediabetes or undiagnosed diabetes:  Hyperglycemia may be caused by those conditions. If you do not have diabetes, hyperglycemia may be caused by:  Certain medicines, including steroid medicines, beta-blockers, epinephrine, and thiazide diuretics.  Stress.  Serious illness.  Surgery.  Diseases of the pancreas.  Infection. What increases the risk? Hyperglycemia is more likely to develop in people who have risk factors for diabetes, such as:  Having a family member with diabetes.  Having a gene for type 1 diabetes that is passed from parent to child (inherited).  Living in an area with cold weather conditions.  Exposure to certain viruses.  Certain conditions in which the body's disease-fighting (immune) system  attacks itself (autoimmune disorders).  Being overweight or obese.  Having an inactive (sedentary) lifestyle.  Having been diagnosed with insulin resistance.  Having a history of prediabetes, gestational diabetes, or polycystic ovarian syndrome (PCOS).  Being of American-Indian, African-American, Hispanic/Latino, or Asian/Pacific Islander descent. What are the signs or symptoms? Hyperglycemia may not cause any symptoms. If you do have symptoms, they may include early warning signs, such as:  Increased thirst.  Hunger.  Feeling very tired.  Needing to urinate more often than usual.  Blurry vision. Other symptoms may develop if hyperglycemia gets worse, such as:  Dry mouth.  Loss of appetite.  Fruity-smelling breath.  Weakness.  Unexpected or rapid weight gain or weight loss.  Tingling or numbness in the hands or feet.  Headache.  Skin that does not quickly return to normal after being lightly pinched and released (poor skin turgor).  Abdominal pain.  Cuts or bruises that are slow to heal. How is this diagnosed? Hyperglycemia is diagnosed with a blood test to measure your blood glucose level. This blood test is usually done while you are having symptoms. Your health care provider may also do a physical exam and review your medical history. You may have more tests to determine the cause of your hyperglycemia, such as:  A fasting blood glucose (FBG) test. You will not be allowed to eat (you will fast) for at least 8 hours before a blood sample is taken.  An A1c (hemoglobin A1c) blood test. This provides information about blood glucose control over the previous 2-3 months.  An oral glucose tolerance test (OGTT). This measures your blood glucose at two times: ? After fasting. This is your baseline blood glucose level. ? Two hours after drinking a beverage that contains glucose. How is this treated? Treatment depends on the cause of your hyperglycemia. Treatment may  include:  Taking medicine to regulate your blood glucose levels. If you take insulin or other diabetes medicines, your medicine or dosage may be adjusted.  Lifestyle changes, such as exercising more, eating healthier foods, or losing weight.  Treating an illness or infection, if this caused your hyperglycemia.  Checking your blood glucose more often.  Stopping or reducing steroid medicines, if these caused your hyperglycemia. If your hyperglycemia becomes severe and it results in hyperglycemic hyperosmolar state, you must be hospitalized and given IV fluids. Follow these instructions at home:  General instructions  Take over-the-counter and prescription medicines only as told by your health care provider.  Do not use any products that contain nicotine or tobacco, such as cigarettes and e-cigarettes. If you need help quitting, ask your health care provider.  Limit alcohol intake  to no more than 1 drink per day for nonpregnant women and 2 drinks per day for men. One drink equals 12 oz of beer, 5 oz of wine, or 1 oz of hard liquor.  Learn to manage stress. If you need help with this, ask your health care provider.  Keep all follow-up visits as told by your health care provider. This is important. Eating and drinking   Maintain a healthy weight.  Exercise regularly, as directed by your health care provider.  Stay hydrated, especially when you exercise, get sick, or spend time in hot temperatures.  Eat healthy foods, such as: ? Lean proteins. ? Complex carbohydrates. ? Fresh fruits and vegetables. ? Low-fat dairy products. ? Healthy fats.  Drink enough fluid to keep your urine clear or pale yellow. If you have diabetes:  Make sure you know the symptoms of hyperglycemia.  Follow your diabetes management plan, as told by your health care provider. Make sure you: ? Take your insulin and medicines as directed. ? Follow your exercise plan. ? Follow your meal plan. Eat on time,  and do not skip meals. ? Check your blood glucose as often as directed. Make sure to check your blood glucose before and after exercise. If you exercise longer or in a different way than usual, check your blood glucose more often. ? Follow your sick day plan whenever you cannot eat or drink normally. Make this plan in advance with your health care provider.  Share your diabetes management plan with people in your workplace, school, and household.  Check your urine for ketones when you are ill and as told by your health care provider.  Carry a medical alert card or wear medical alert jewelry. Contact a health care provider if:  Your blood glucose is at or above 240 mg/dL (13.3 mmol/L) for 2 days in a row.  You have problems keeping your blood glucose in your target range.  You have frequent episodes of hyperglycemia. Get help right away if:  You have difficulty breathing.  You have a change in how you think, feel, or act (mental status).  You have nausea or vomiting that does not go away. These symptoms may represent a serious problem that is an emergency. Do not wait to see if the symptoms will go away. Get medical help right away. Call your local emergency services (911 in the [place] Do not drive yourself to the hospital. Summary  Hyperglycemia occurs when the level of sugar (glucose) in the blood is too high.  Hyperglycemia is diagnosed with a blood test to measure your blood glucose level. This blood test is usually done while you are having symptoms. Your health care provider may also do a physical exam and review your medical history.  If you have diabetes, follow your diabetes management plan as told by your health care provider.  Contact your health care provider if you have problems keeping your blood glucose in your target range. This information is not intended to replace advice given to you by your health care provider. Make sure you discuss any questions you have with  your health care provider. Document Revised: 07/04/2016 Document Reviewed: 07/04/2016 Elsevier Patient Education  [place]. Hemoglobin A1c Test Why am I having this test? You may have the hemoglobin A1c test (HbA1c test) done to:  Evaluate your risk for developing diabetes (diabetes mellitus).  Diagnose diabetes.  Monitor long-term control of blood sugar (glucose) in people who have diabetes and help make treatment decisions.  This test may be done with other blood glucose tests, such as fasting blood glucose and oral glucose tolerance tests. What is being tested? Hemoglobin is a type of protein in the blood that carries oxygen. Glucose attaches to hemoglobin to form glycated hemoglobin. This test checks the amount of glycated hemoglobin in your blood, which is a good indicator of the average amount of glucose in your blood during the past 2-3 months. What kind of sample is taken?  A blood sample is required for this test. It is usually collected by inserting a needle into a blood vessel. Tell a health care provider about:  All medicines you are taking, including vitamins, herbs, eye drops, creams, and over-the-counter medicines.  Any blood disorders you have.  Any surgeries you have had.  Any medical conditions you have.  Whether you are pregnant or may be pregnant. How are the results reported? Your results will be reported as a percentage that indicates how much of your hemoglobin has glucose attached to it (is glycated). Your health care provider will compare your results to normal ranges that were established after testing a large group of people (reference ranges). Reference ranges may vary among labs and hospitals. For this test, common reference ranges are:  Adult or child without diabetes: 4-5.6%.  Adult or child with diabetes and good blood glucose control: less than 7%. What do the results mean? If you have diabetes:  A result of less than 7% is considered  normal, meaning that your blood glucose is well controlled.  A result higher than 7% means that your blood glucose is not well controlled, and your treatment plan may need to be adjusted. If you do not have diabetes:  A result within the reference range is considered normal, meaning that you are not at high risk for diabetes.  A result of 5.7-6.4% means that you have a high risk of developing diabetes, and you may have prediabetes. Prediabetes is the condition of having a blood glucose level that is higher than it should be, but not high enough for you to be diagnosed with diabetes. Having prediabetes puts you at risk for developing type 2 diabetes (type 2 diabetes mellitus). You may have more tests, including a repeat HbA1c test.  Results of 6.5% or higher on two separate HbA1c tests mean that you have diabetes. You may have more tests to confirm the diagnosis. Abnormally low HbA1c values may be caused by:  Pregnancy.  Severe blood loss.  Receiving donated blood (transfusions).  Low red blood cell count (anemia).  Long-term kidney failure.  Some unusual forms (variants) of hemoglobin. Talk with your health care provider about what your results mean. Questions to ask your health care provider Ask your health care provider, or the department that is doing the test:  When will my results be ready?  How will I get my results?  What are my treatment options?  What other tests do I need?  What are my next steps? Summary  The hemoglobin A1c test (HbA1c test) may be done to evaluate your risk for developing diabetes, to diagnose diabetes, and to monitor long-term control of blood sugar (glucose) in people who have diabetes and help make treatment decisions.  Hemoglobin is a type of protein in the blood that carries oxygen. Glucose attaches to hemoglobin to form glycated hemoglobin. This test checks the amount of glycated hemoglobin in your blood, which is a good indicator of the  average amount of glucose in your blood during  the past 2-3 months.  Talk with your health care provider about what your results mean. This information is not intended to replace advice given to you by your health care provider. Make sure you discuss any questions you have with your health care provider. Document Revised: 09/29/2017 Document Reviewed: 05/30/2017 Elsevier Patient Education  [place] With Diabetes  Foods with carbohydrates make your blood glucose level go up. Learning how to count carbohydrates can help you control your blood glucose levels. First, identify the foods you eat that contain carbohydrates. Then, using the Foods with Carbohydrates chart, determine about how much carbohydrates are in your meals and snacks. Make sure you are eating foods with fiber, protein, and healthy fat along with your carbohydrate foods. Foods with Carbohydrates The following table shows carbohydrate foods that have about 15 grams of carbohydrate each. Using measuring cups, spoons, or a food scale when you first begin learning about carbohydrate counting can help you learn about the portion sizes you typically eat. The following foods have 15 grams carbohydrate each:  Grains . 1 slice bread (1 ounce)  . 1 small tortilla (6-inch size)  .  large bagel (1 ounce)  . 1/3 cup pasta or rice (cooked)  .  hamburger or hot dog bun ( ounce)  .  cup cooked cereal  .  to  cup ready-to-eat cereal  . 2 taco shells (5-inch size) Fruit . 1 small fresh fruit ( to 1 cup)  .  medium banana  . 17 small grapes (3 ounces)  . 1 cup melon or berries  .  cup canned or frozen fruit  . 2 tablespoons dried fruit (blueberries, cherries, cranberries, raisins)  .  cup unsweetened fruit juice  Starchy Vegetables .  cup cooked beans, peas, corn, potatoes/sweet potatoes  .  large baked potato (3 ounces)  . 1 cup acorn or butternut squash  Snack Foods . 3 to 6 crackers   . 8 potato chips or 13 tortilla chips ( ounce to 1 ounce)  . 3 cups popped popcorn  Dairy . 3/4 cup (6 ounces) nonfat plain yogurt, or yogurt with sugar-free sweetener  . 1 cup milk  . 1 cup plain rice, soy, coconut or flavored almond milk Sweets and Desserts .  cup ice cream or frozen yogurt  . 1 tablespoon jam, jelly, pancake syrup, table sugar, or honey  . 2 tablespoons light pancake syrup  . 1 inch square of frosted cake or 2 inch square of unfrosted cake  . 2 small cookies (2/3 ounce each) or  large cookie  Sometimes you'll have to estimate carbohydrate amounts if you don't know the exact recipe. One cup of mixed foods like soups can have 1 to 2 carbohydrate servings, while some casseroles might have 2 or more servings of carbohydrate. Foods that have less than 20 calories in each serving can be counted as "free" foods. Count 1 cup raw vegetables, or  cup cooked non-starchy vegetables as "free" foods. If you eat 3 or more servings at one meal, then count them as 1 carbohydrate serving.  Foods without Carbohydrates  Not all foods contain carbohydrates. Meat, some dairy, fats, non-starchy vegetables, and many beverages don't contain carbohydrate. So when you count carbohydrates, you can generally exclude chicken, pork, beef, fish, seafood, eggs, tofu, cheese, butter, sour cream, avocado, nuts, seeds, olives, mayonnaise, water, black coffee, unsweetened tea, and zero-calorie drinks. Vegetables with no or low carbohydrate include green beans, cauliflower, tomatoes, and onions.  How much carbohydrate should I eat at each meal?  Carbohydrate counting can help you plan your meals and manage your weight. Following are some starting points for carbohydrate intake at each meal. Work with your registered dietitian nutritionist to find the best range that works for your blood glucose and weight.   To Lose Weight To Maintain Weight  Women 2 - 3 carb servings 3 - 4 carb servings  Men 3 - 4 carb  servings 4 - 5 carb servings  Checking your blood glucose after meals will help you know if you need to adjust the timing, type, or number of carbohydrate servings in your meal plan. Achieve and keep a healthy body weight by balancing your food intake and physical activity.  Tips How should I plan my meals?  Plan for half the food on your plate to include non-starchy vegetables, like salad greens, broccoli, or carrots. Try to eat 3 to 5 servings of non-starchy vegetables every day. Have a protein food at each meal. Protein foods include chicken, fish, meat, eggs, or beans (note that beans contain carbohydrate). These two food groups (non-starchy vegetables and proteins) are low in carbohydrate. If you fill up your plate with these foods, you will eat less carbohydrate but still fill up your stomach. Try to limit your carbohydrate portion to  of the plate.  What fats are healthiest to eat?  Diabetes increases risk for heart disease. To help protect your heart, eat more healthy fats, such as olive oil, nuts, and avocado. Eat less saturated fats like butter, cream, and high-fat meats, like bacon and sausage. Avoid trans fats, which are in all foods that list "partially hydrogenated oil" as an ingredient. What should I drink?  Choose drinks that are not sweetened with sugar. The healthiest choices are water, carbonated or seltzer waters, and tea and coffee without added sugars.  Sweet drinks will make your blood glucose go up very quickly. One serving of soda or energy drink is  cup. It is best to drink these beverages only if your blood glucose is low.  Artificially sweetened, or diet drinks, typically do not increase your blood glucose if they have zero calories in them. Read labels of beverages, as some diet drinks do have carbohydrate and will raise your blood glucose. Label Reading Tips Read Nutrition Facts labels to find out how many grams of carbohydrate are in a food you want to eat. Don't forget:  sometimes serving sizes on the label aren't the same as how much food you are going to eat, so you may need to calculate how much carbohydrate is in the food you are serving yourself.   Carbohydrate Counting for People with Diabetes Sample 1-Day Menu  Breakfast  cup yogurt, low fat, low sugar (1 carbohydrate serving)   cup cereal, ready-to-eat, unsweetened (1 carbohydrate serving)  1 cup strawberries (1 carbohydrate serving)   cup almonds ( carbohydrate serving)  Lunch 1, 5 ounce can chunk light tuna  2 ounces cheese, low fat cheddar  6 whole wheat crackers (1 carbohydrate serving)  1 small apple (1 carbohydrate servings)   cup carrots ( carbohydrate serving)   cup snap peas  1 cup 1% milk (1 carbohydrate serving)   Evening Meal Stir fry made with: 3 ounces chicken  1 cup brown rice (3 carbohydrate servings)   cup broccoli ( carbohydrate serving)   cup green beans   cup onions  1 tablespoon olive oil  2 tablespoons teriyaki sauce ( carbohydrate serving)  Evening Snack 1 extra small banana (1 carbohydrate serving)  1 tablespoon peanut butter   Carbohydrate Counting for People with Diabetes Vegan Sample 1-Day Menu  Breakfast 1 cup cooked oatmeal (2 carbohydrate servings)   cup blueberries (1 carbohydrate serving)  2 tablespoons flaxseeds  1 cup soymilk fortified with calcium and vitamin D  1 cup coffee  Lunch 2 slices whole wheat bread (2 carbohydrate servings)   cup baked tofu   cup lettuce  2 slices tomato  2 slices avocado   cup baby carrots ( carbohydrate serving)  1 orange (1 carbohydrate serving)  1 cup soymilk fortified with calcium and vitamin D   Evening Meal Burrito made with: 1 6-inch corn tortilla (1 carbohydrate serving)  1 cup refried vegetarian beans (2 carbohydrate servings)   cup chopped tomatoes   cup lettuce   cup salsa  1/3 cup brown rice (1 carbohydrate serving)  1 tablespoon olive oil for rice   cup zucchini   Evening Snack 6  small whole grain crackers (1 carbohydrate serving)  2 apricots ( carbohydrate serving)   cup unsalted peanuts ( carbohydrate serving)    Carbohydrate Counting for People with Diabetes Vegetarian (Lacto-Ovo) Sample 1-Day Menu  Breakfast 1 cup cooked oatmeal (2 carbohydrate servings)   cup blueberries (1 carbohydrate serving)  2 tablespoons flaxseeds  1 egg  1 cup 1% milk (1 carbohydrate serving)  1 cup coffee  Lunch 2 slices whole wheat bread (2 carbohydrate servings)  2 ounces low-fat cheese   cup lettuce  2 slices tomato  2 slices avocado   cup baby carrots ( carbohydrate serving)  1 orange (1 carbohydrate serving)  1 cup unsweetened tea  Evening Meal Burrito made with: 1 6-inch corn tortilla (1 carbohydrate serving)   cup refried vegetarian beans (1 carbohydrate serving)   cup tomatoes   cup lettuce   cup salsa  1/3 cup brown rice (1 carbohydrate serving)  1 tablespoon olive oil for rice   cup zucchini  1 cup 1% milk (1 carbohydrate serving)  Evening Snack 6 small whole grain crackers (1 carbohydrate serving)  2 apricots ( carbohydrate serving)   cup unsalted peanuts ( carbohydrate serving)    Copyright 2020  Academy of [employer] and Dietetics. All rights reserved.  Using Nutrition Labels: Carbohydrate  . Serving Size  . Look at the serving size. All the information on the label is based on this portion. Randol Kern Per Container  . The number of servings contained in the package. . Guidelines for Carbohydrate  . Look at the total grams of carbohydrate in the serving size.  . 1 carbohydrate choice = 15 grams of carbohydrate. Range of Carbohydrate Grams Per Choice  Carbohydrate Grams/Choice Carbohydrate Choices  6-10   11-20 1  21-25 1  26-35 2  36-40 2  41-50 3  51-55 3  56-65 4  66-70 4  71-80 5    Copyright 2020  Academy of Nutrition and Dietetics. All rights reserved.

## 2020-10-28 LAB — BASIC METABOLIC PANEL
Anion gap: 10 (ref 5–15)
BUN: 13 mg/dL (ref 6–20)
CO2: 18 mmol/L — ABNORMAL LOW (ref 22–32)
Calcium: 8.9 mg/dL (ref 8.9–10.3)
Chloride: 107 mmol/L (ref 98–111)
Creatinine, Ser: 1.24 mg/dL — ABNORMAL HIGH (ref 0.44–1.00)
GFR, Estimated: 55 mL/min — ABNORMAL LOW (ref >60)
Glucose, Bld: 216 mg/dL — ABNORMAL HIGH (ref 70–99)
Potassium: 4.1 mmol/L (ref 3.5–5.1)
Sodium: 135 mmol/L (ref 135–145)

## 2020-10-28 LAB — GLUCOSE, CAPILLARY
Glucose-Capillary: 213 mg/dL — ABNORMAL HIGH (ref 70–99)
Glucose-Capillary: 234 mg/dL — ABNORMAL HIGH (ref 70–99)
Glucose-Capillary: 264 mg/dL — ABNORMAL HIGH (ref 70–99)
Glucose-Capillary: 265 mg/dL — ABNORMAL HIGH (ref 70–99)
Glucose-Capillary: 325 mg/dL — ABNORMAL HIGH (ref 70–99)
Glucose-Capillary: 362 mg/dL — ABNORMAL HIGH (ref 70–99)
Glucose-Capillary: 376 mg/dL — ABNORMAL HIGH (ref 70–99)
Glucose-Capillary: 379 mg/dL — ABNORMAL HIGH (ref 70–99)

## 2020-10-28 MED ORDER — INSULIN GLARGINE 100 UNIT/ML ~~LOC~~ SOLN
60.0000 [IU] | Freq: Two times a day (BID) | SUBCUTANEOUS | Status: DC
  Administered 2020-10-28 – 2020-10-29 (×2): 60 [IU] via SUBCUTANEOUS
  Filled 2020-10-28 (×3): qty 0.6

## 2020-10-28 MED ORDER — METFORMIN HCL ER 500 MG PO TB24
500.0000 mg | ORAL_TABLET | Freq: Every day | ORAL | Status: DC
  Administered 2020-10-29: 500 mg via ORAL
  Filled 2020-10-28 (×4): qty 1

## 2020-10-28 MED ORDER — INSULIN GLARGINE 100 UNIT/ML ~~LOC~~ SOLN
30.0000 [IU] | Freq: Two times a day (BID) | SUBCUTANEOUS | Status: DC
  Filled 2020-10-28 (×2): qty 0.3

## 2020-10-28 MED ORDER — INSULIN GLARGINE 100 UNIT/ML ~~LOC~~ SOLN
40.0000 [IU] | Freq: Two times a day (BID) | SUBCUTANEOUS | Status: DC
  Administered 2020-10-28: 40 [IU] via SUBCUTANEOUS
  Filled 2020-10-28 (×3): qty 0.4

## 2020-10-28 NOTE — Progress Notes (Signed)
Inpatient Diabetes Program Recommendations  AACE/ADA: New Consensus Statement on Inpatient Glycemic Control (2015)  Target Ranges:  Prepandial:   less than 140 mg/dL      Peak postprandial:   less than 180 mg/dL (1-2 hours)      Critically ill patients:  140 - 180 mg/dL   Lab Results  Component Value Date   GLUCAP 376 (H) 10/28/2020   HGBA1C 9.8 (H) 10/26/2020    Review of Glycemic Control Results for Alexandra Walls, Alexandra Walls (MRN [id number]) as of 10/28/2020 13:54  Ref. Range 10/28/2020 04:11 10/28/2020 07:32 10/28/2020 12:38  Glucose-Capillary Latest Ref Range: 70 - 99 mg/dL 213 (H) 234 (H) 376 (H)   Diabetes history: GDM 12 years ago New onset DM2 Current orders for Inpatient glycemic control: Lantus 40 units bid Novolog 0-20 units Q4H  Inpatient Diabetes Program Recommendations:    Noted insulin adjustment and elevated post prandials.  Consider the following: -Novolog 0-20 units TID & HS -Novolog 5 units TID (assuming patient consumes >50% of meals).   In preparation for discharge would anticipate patient to need insulin. -Blood glucose meter (inlcudes lancets and strips) (#[id number]) -If patient to be discharged on insulin: Levemir preferred: #[id number] -Pen needles [phone]  Spoke with patient again to review concepts discussed on prior visits. Freestyle Libre sensor applied to patient's right upper arm. Reviewed cost, benefits, risks and when to follow up with PCP. Also, reviewed survival skills and interventions. Patient has no further questions at this time.   Thanks, Bronson Curb, MSN, RNC-OB Diabetes Coordinator [phone] (8a-5p)

## 2020-10-28 NOTE — Progress Notes (Signed)
   Subjective:  Patient feeling well this morning. No acute complaints. 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 Contacted her PCP and has follow up next W[place].  Objective:   Vital signs in last 24 hours: Vitals:   10/27/20 2356 10/28/20 0100 10/28/20 0400 10/28/20 0416  BP: 136/76   132/85  Pulse: (!) 115 (!) 115 (!) 106 (!) 102  Resp: 16 20 17 19   Temp:    98.7 F (37.1 C)  TempSrc:    Oral  SpO2: 99% 99% 99% 97%  Weight:      Height:       BMP Latest Ref Rng & Units 10/27/2020 10/26/2020 10/26/2020  Glucose 70 - 99 mg/dL 283(H) 398(H) 212(H)  BUN 6 - 20 mg/dL 18 18 16   Creatinine 0.44 - 1.00 mg/dL 1.39(H) 1.50(H) 1.35(H)  Sodium 135 - 145 mmol/L 136 141 148(H)  Potassium 3.5 - 5.1 mmol/L 3.6 4.0 3.9  Chloride 98 - 111 mmol/L 105 108 111  CO2 22 - 32 mmol/L 21(L) 23 28  Calcium 8.9 - 10.3 mg/dL 8.9 9.3 9.5   Physical Exam: General: obese female, sitting up in bedin bed, NAD. CV: normal rate and regular rhythm, + systolic murmur. Pulm: CTABL Abdomen: soft, nontender, nondistended. +BS. Neuro: AAOx3  Assessment/Plan:  Principal Problem:   DKA (diabetic ketoacidosis) ([place]) Active Problems:   Acute kidney injury [facility])  Ms. Hendrie is a 44 yo female with no significant PMH presenting with severe hyperglycemia, anion gap metabolic acidosis, and ketonuria, consistent with DKA from new onset T2DM.  Diabetic ketoacidosis 2/2 new onset T2DM Patient continues with elevated glucose to 435 yesterday evening. Improved in additional 25 units of aspart to 213 this morning. Will increase her basal insulin to 40 units this morning and reassess. Will need insulin at discharge and close follow up with PCP to adjust diabetes medication. Plan for discharge this afternoon if glucose is controlled. -Lantus 40 units this morning -Moderate SSI  Acute kidney injury Improved to 1.24 today. Will need protein/cr checked as  outpatient. -monitor bmp  Prior to Admission Living Arrangement: Home Anticipated Discharge Location: Home Barriers to Discharge: continued medical management Dispo: Anticipated discharge today.  Iona Beard, MD 10/28/2020, 6:37 AM Pager: [phone] After 5pm on weekdays and 1pm on weekends: On Call pager [phone]

## 2020-10-29 ENCOUNTER — Other Ambulatory Visit (HOSPITAL_COMMUNITY): Payer: Self-pay | Admitting: Student

## 2020-10-29 LAB — CBC
HCT: 30.6 % — ABNORMAL LOW (ref 36.0–46.0)
Hemoglobin: 10.7 g/dL — ABNORMAL LOW (ref 12.0–15.0)
MCH: 31.8 pg (ref 26.0–34.0)
MCHC: 35 g/dL (ref 30.0–36.0)
MCV: 90.8 fL (ref 80.0–100.0)
Platelets: DECREASED 10*3/uL (ref 150–400)
RBC: 3.37 MIL/uL — ABNORMAL LOW (ref 3.87–5.11)
RDW: 12.9 % (ref 11.5–15.5)
WBC: 3.5 10*3/uL — ABNORMAL LOW (ref 4.0–10.5)
nRBC: 0 % (ref 0.0–0.2)

## 2020-10-29 LAB — GLUCOSE, CAPILLARY
Glucose-Capillary: 92 mg/dL (ref 70–99)
Glucose-Capillary: 184 mg/dL — ABNORMAL HIGH (ref 70–99)
Glucose-Capillary: 231 mg/dL — ABNORMAL HIGH (ref 70–99)
Glucose-Capillary: 237 mg/dL — ABNORMAL HIGH (ref 70–99)

## 2020-10-29 LAB — BASIC METABOLIC PANEL
Anion gap: 8 (ref 5–15)
BUN: 10 mg/dL (ref 6–20)
CO2: 25 mmol/L (ref 22–32)
Calcium: 8.8 mg/dL — ABNORMAL LOW (ref 8.9–10.3)
Chloride: 106 mmol/L (ref 98–111)
Creatinine, Ser: 1.22 mg/dL — ABNORMAL HIGH (ref 0.44–1.00)
GFR, Estimated: 56 mL/min — ABNORMAL LOW (ref >60)
Glucose, Bld: 134 mg/dL — ABNORMAL HIGH (ref 70–99)
Potassium: 3 mmol/L — ABNORMAL LOW (ref 3.5–5.1)
Sodium: 139 mmol/L (ref 135–145)

## 2020-10-29 LAB — TSH: TSH: 1.013 u[IU]/mL (ref 0.350–4.500)

## 2020-10-29 MED ORDER — METFORMIN HCL ER 500 MG PO TB24: 500.0000 mg | ORAL_TABLET | Freq: Every day | ORAL | 0 refills | Status: DC

## 2020-10-29 MED ORDER — INSUPEN PEN NEEDLES 32G X 4 MM MISC: 1.0000 [IU] | Freq: Two times a day (BID) | 0 refills | Status: DC

## 2020-10-29 MED ORDER — INSULIN GLARGINE 100 UNIT/ML ~~LOC~~ SOLN: 60.0000 [IU] | Freq: Every day | SUBCUTANEOUS | Status: DC

## 2020-10-29 MED ORDER — INSULIN ASPART 100 UNIT/ML ~~LOC~~ SOLN
0.0000 [IU] | SUBCUTANEOUS | Status: DC
  Administered 2020-10-29: 3 [IU] via SUBCUTANEOUS

## 2020-10-29 MED ORDER — POTASSIUM CHLORIDE 20 MEQ PO PACK
40.0000 meq | PACK | ORAL | Status: AC
  Administered 2020-10-29 (×2): 40 meq via ORAL
  Filled 2020-10-29 (×2): qty 2

## 2020-10-29 MED ORDER — INSULIN DETEMIR 100 UNIT/ML FLEXPEN: 60.0000 [IU] | PEN_INJECTOR | Freq: Every day | SUBCUTANEOUS | 0 refills | Status: DC

## 2020-10-29 MED ORDER — INSULIN ASPART 100 UNIT/ML FLEXPEN: 20.0000 [IU] | PEN_INJECTOR | Freq: Three times a day (TID) | SUBCUTANEOUS | 0 refills | Status: DC

## 2020-10-29 MED ORDER — INSULIN ASPART 100 UNIT/ML ~~LOC~~ SOLN: 20.0000 [IU] | Freq: Three times a day (TID) | SUBCUTANEOUS | Status: DC

## 2020-10-29 MED FILL — LEVEMIR FLEXTOUCH 100 UNITS: 100 | 30 days supply | Qty: 18 | Fill #0

## 2020-10-29 MED FILL — NOVOLOG FLEXPEN SYRINGE: 100 | 30 days supply | Qty: 18 | Fill #0

## 2020-10-29 MED FILL — METFORMIN HCL ER 500 MG TB2: 500 | 30 days supply | Qty: 30 | Fill #0

## 2020-10-29 MED FILL — BD PEN NDL NANO 32GX5/32: 32G X 4 MM | 50 days supply | Qty: 200 | Fill #0

## 2020-10-29 NOTE — Plan of Care (Signed)
  Problem: Education: Goal: Knowledge of General Education information will improve Description: Including pain rating scale, medication(s)/side effects and non-pharmacologic comfort measures Outcome: Adequate for Discharge   Problem: Clinical Measurements: Goal: Ability to maintain clinical measurements within normal limits will improve Outcome: Adequate for Discharge Goal: Will remain free from infection Outcome: Adequate for Discharge Goal: Diagnostic test results will improve Outcome: Adequate for Discharge Goal: Cardiovascular complication will be avoided Outcome: Adequate for Discharge   Problem: Activity: Goal: Risk for activity intolerance will decrease Outcome: Adequate for Discharge   Problem: Elimination: Goal: Will not experience complications related to bowel motility Outcome: Adequate for Discharge Goal: Will not experience complications related to urinary retention Outcome: Adequate for Discharge   Problem: Pain Managment: Goal: General experience of comfort will improve Outcome: Adequate for Discharge   Problem: Safety: Goal: Ability to remain free from injury will improve Outcome: Adequate for Discharge   Problem: Skin Integrity: Goal: Risk for impaired skin integrity will decrease Outcome: Adequate for Discharge

## 2020-10-29 NOTE — Progress Notes (Addendum)
   Subjective:   Patient denies any CP, SOB, burning with urination or pain when she coughs. States she was instructed to take her insulin right before she eats. She was informed that she will take a long-acting insulin at night and a short-acting one with her meals. States she never felt bad before she came in. Patient was able to show team how to use the meter. She states she is comfortable giving herself the insulin.   Objective:   Vital signs in last 24 hours: Vitals:   10/28/20 1612 10/28/20 1943 10/28/20 2347 09/30/2020 0459  BP: 125/84 (!) 144/96 (!) 141/92 (!) 138/97  Pulse: (!) 106 (!) 102 92 100  Resp: 20 20 18 18   Temp: 100.1 F (37.8 C) 99.6 F (37.6 C) 98.4 F (36.9 C) 98.5 F (36.9 C)  TempSrc:  Oral  Oral  SpO2: 99% 99% 100% 100%  Weight:      Height:       BMP Latest Ref Rng & Units 10/27/2020 10/26/2020 10/26/2020  Glucose 70 - 99 mg/dL 283(H) 398(H) 212(H)  BUN 6 - 20 mg/dL 18 18 16   Creatinine 0.44 - 1.00 mg/dL 1.39(H) 1.50(H) 1.35(H)  Sodium 135 - 145 mmol/L 136 141 148(H)  Potassium 3.5 - 5.1 mmol/L 3.6 4.0 3.9  Chloride 98 - 111 mmol/L 105 108 111  CO2 22 - 32 mmol/L 21(L) 23 28  Calcium 8.9 - 10.3 mg/dL 8.9 9.3 9.5   Physical Exam: General: obese female, sitting up in bedin bed, NAD. CV: normal rate and regular rhythm, + systolic murmur, no edema Abdomen: soft, contender to palpation Neuro: AAOx3  Assessment/Plan:  Principal Problem:   DKA (diabetic ketoacidosis) ([place]) Active Problems:   Acute kidney injury [facility])  Ms. Ade is a 44 yo female with no significant PMH presenting with severe hyperglycemia, anion gap metabolic acidosis, and ketonuria, consistent with DKA from new onset T2DM.  Diabetic ketoacidosis 2/2 new onset T2DM CBG improved to 184 this morning with increase in Lantus to 60 twice daily and started on metformin. Patient denies any symptoms of infection, bhcg negative on admission.  -Will transition her to lantus 60 units daily,  and novolog 20 units with meals three times daily -TSH -Will discharge home on novolog 20 units three times and levemir 60 units at night in addition to metformin. Discussed that she can adjust insulin based on trends of her blood sugar and will need to decrease dosage if blood sugars <70.  - She has follow up with PCP next week   Prior to Admission Living Arrangement: Home Anticipated Discharge Location: Home Barriers to Discharge: none Dispo: Anticipated discharge today.  Iona Beard, MD 09/30/2020, 5:36 AM Pager: [phone] After 5pm on weekdays and 1pm on weekends: On Call pager [phone]

## 2020-10-31 NOTE — Death Summary Note (Deleted)
Name: Alexandra Walls MRN: [id number] DOB: 04-24-1976 45 y.o. PCP: Parke Simmers., MD  Date of Admission: 10/25/2020  3:42 PM Date of Discharge: 10/28/2020 Attending Physician: Lalla Brothers, MD  Discharge Diagnosis: 1. New onset T2DM 2. Diabetic ketoacidosis 3. Acute kidney injury  Discharge Medications: Allergies as of 10/23/2020      Reactions   Penicillins    Did it involve swelling of the face/tongue/throat, SOB, or low BP? N Did it involve sudden or severe rash/hives, skin peeling, or any reaction on the inside of your mouth or nose? Y Did you need to seek medical attention at a hospital or doctor's office? N When did it last happen?Several Years Ago If all above answers are "NO", may proceed with cephalosporin use.      Medication List    TAKE these medications   APPLE CIDER VINEGAR PO Take 1-2 tablets by mouth daily as needed (blood pressure).   insulin aspart 100 UNIT/ML FlexPen Commonly known as: NOVOLOG Inject 20 Units into the skin 3 (three) times daily with meals.   insulin detemir 100 UNIT/ML FlexPen Commonly known as: LEVEMIR Inject 60 Units into the skin at bedtime.   Insupen Pen Needles 32G X 4 MM Misc Generic drug: Insulin Pen Needle 1 Units by Does not apply route in the morning and at bedtime.   medroxyPROGESTERone 150 MG/ML injection Commonly known as: DEPO-PROVERA Inject 150 mg into the muscle every 3 (three) months.   metFORMIN 500 MG 24 hr tablet Commonly known as: GLUCOPHAGE-XR Take 1 tablet (500 mg total) by mouth daily with breakfast.   Multivitamin Adult Chew Chew 1 tablet by mouth daily.       Disposition and follow-up:   Ms.Ellieana T Fossett was discharged from [facility] in Stable condition.  At the hospital follow up visit please address:  1.  Type 2 diabetes: Please assess glycemic control and tolerance of and access to diabetes medications and adjust as appropriate. Patient may benefit from  additional diabetic education on lifestyle modifications and glycemic control.  Assess for comorbid conditions such as HTn and HLD and need to start on new interventions.  Acute kidney injury: Please assess kidney function, tolerance of new medications.   2.  Labs / imaging needed at time of follow-up: BMP, CBG, protein/cr ratio  3.  Pending labs/ test needing follow-up: None  Follow-up Appointments: Has follow up with her PCP on 11/05/2019  Hospital Course by problem list:  Diabetic ketoacidosis, new onset T2DM Patient with history of gestational diabetes presenting with blood sugar in the 600s and with polydipsia and polyuria for 1 week.  BMP shows anion gap metabolic acidosis consistent with DKA.  UA shows ketonuria.  Patient was started on Insulin drip with IV fluid resuscitation. Blood glucose improve and BMP showed resolution of anion gap. Started on subcutaneous insulin and SSI. A1c of 9.8. Patient noted to have high insulin requirements, unclear if there is an underlying cause to this. She has been asymptomatic, afebrile, without leukocytosis. Negative bHCG and normal TSH. Provided with diabetes education and CBG monitor. Discharge on 60 units levemir every night and 20 units novolog three times daily with meals, and metformin. Has close follow up with PCP next week.   Acute kidney injury Patient presented with elevated Cr on admission likely in the setting of dehydration the setting of DKA. Previously Cr of around 1. On discharge Cr. Is improved to 1.2 this may be her new baseline and may have some  underlying kidney damge from uncontrolled diabetes.  Heart murmur Patient reports history of heart murmur that was diagnosed when she was little.  She has not had any problems with her heart.  States that she was told that she need antibiotics before any procedure.  This could be mitral valve prolapse.  Because patient has not have any issue for many years, can consider obtaining an  echocardiogram outpatient for evaluation  Discharge Vitals:   BP (!) 139/95 (BP Location: Left Arm)   Pulse 94   Temp 98.5 F (36.9 C)   Resp 16   Ht 5' 6"  (1.676 m)   Wt 95.3 kg   LMP 10/25/2020 (Exact Date)   SpO2 100%   BMI 33.89 kg/m   Pertinent Labs, Studies, and Procedures:  CMP Latest Ref Rng & Units 10/02/2020 10/28/2020 10/27/2020  Glucose 70 - 99 mg/dL 134(H) 216(H) 445(H)  BUN 6 - 20 mg/dL 10 13 17   Creatinine 0.44 - 1.00 mg/dL 1.22(H) 1.24(H) 1.39(H)  Sodium 135 - 145 mmol/L 139 135 132(L)  Potassium 3.5 - 5.1 mmol/L 3.0(L) 4.1 4.2  Chloride 98 - 111 mmol/L 106 107 102  CO2 22 - 32 mmol/L 25 18(L) 20(L)  Calcium 8.9 - 10.3 mg/dL 8.8(L) 8.9 9.0  Total Protein - - - -  Total Bilirubin - - - -  Alkaline Phos - - - -  AST - - - -  ALT - - - -   CBG (last 3)  Recent Labs    10/09/2020 0741 10/01/2020 1133 10/27/2020 1614  GLUCAP 184* 231* 237*     Discharge Instructions: Discharge Instructions    Diet - low sodium heart healthy   Complete by: As directed    Discharge instructions   Complete by: As directed    You were hospitalized for diabetic ketoacidosis and new onset diabetes. Thank you for allowing [place] to be part of your care.    Please follow up with you primary care doctor next Wednesday.   Please start the follow medications: Novolog 20 units three times daily with meals Levemir 60 units at night Metformin 500 mg daily  Please make sure to monitor trends in your blood sugar. If you are experiencing blood sugars <70 please reduce novolog to 15 units and levemir to 50 units. If you blood sugars are consistently  above 200 please call your primary care doctor to have medications adjusted.  Please call our clinic if you have any questions or concerns, we may be able to help and keep you from a long and expensive emergency room wait. Our clinic and after hours phone number is [phone], the best time to call is Monday through Friday 9 am to 4 pm but  there is always someone available 24/7 if you have an emergency. If you need medication refills please notify your pharmacy one week in advance and they will send [place] a request.   Increase activity slowly   Complete by: As directed       Signed: Iona Beard, MD 10/30/2020, 8:09 AM   Pager: [phone]

## 2020-10-31 NOTE — Discharge Summary (Signed)
Name: Alexandra Walls MRN: [id number] DOB: July 26, 1976 45 y.o. PCP: Parke Simmers., MD  Date of Admission: 10/25/2020  3:42 PM Date of Discharge: 10/20/2020 Attending Physician: Lalla Brothers, MD  Discharge Diagnosis: 1. New onset T2DM 2. Diabetic ketoacidosis 3. Acute kidney injury  Discharge Medications:      Allergies as of 10/10/2020      Reactions   Penicillins    Did it involve swelling of the face/tongue/throat, SOB, or low BP? N Did it involve sudden or severe rash/hives, skin peeling, or any reaction on the inside of your mouth or nose? Y Did you need to seek medical attention at a hospital or doctor's office? N When did it last happen?Several Years Ago If all above answers are "NO", may proceed with cephalosporin use.         Medication List    TAKE these medications   APPLE CIDER VINEGAR PO Take 1-2 tablets by mouth daily as needed (blood pressure).   insulin aspart 100 UNIT/ML FlexPen Commonly known as: NOVOLOG Inject 20 Units into the skin 3 (three) times daily with meals.   insulin detemir 100 UNIT/ML FlexPen Commonly known as: LEVEMIR Inject 60 Units into the skin at bedtime.   Insupen Pen Needles 32G X 4 MM Misc Generic drug: Insulin Pen Needle 1 Units by Does not apply route in the morning and at bedtime.   medroxyPROGESTERone 150 MG/ML injection Commonly known as: DEPO-PROVERA Inject 150 mg into the muscle every 3 (three) months.   metFORMIN 500 MG 24 hr tablet Commonly known as: GLUCOPHAGE-XR Take 1 tablet (500 mg total) by mouth daily with breakfast.   Multivitamin Adult Chew Chew 1 tablet by mouth daily.       Disposition and follow-up:   Alexandra Walls was discharged from [facility] in Stable condition.  At the hospital follow up visit please address:  1.  Type 2 diabetes: Please assess glycemic control and tolerance of and access to diabetes medications and adjust as  appropriate. Patient may benefit from additional diabetic education on lifestyle modifications and glycemic control.  Assess for comorbid conditions such as HTn and HLD and need to start on new interventions.  Acute kidney injury: Please assess kidney function, tolerance of new medications.   2.  Labs / imaging needed at time of follow-up: BMP, CBG, protein/cr ratio  3.  Pending labs/ test needing follow-up: None  Follow-up Appointments: Has follow up with her PCP on 11/05/2019  Hospital Course by problem list:  Diabetic ketoacidosis, new onset T2DM Patient with history of gestational diabetes presenting with blood sugar in the 600s and with polydipsia and polyuria for 1 week. BMP shows anion gap metabolic acidosis consistent with DKA. UA shows ketonuria. Patient was started on Insulin drip with IV fluid resuscitation. Blood glucose improve and BMP showed resolution of anion gap. Started on subcutaneous insulin and SSI. A1c of 9.8. Patient noted to have high insulin requirements, unclear if there is an underlying cause to this. She has been asymptomatic, afebrile, without leukocytosis. Negative bHCG and normal TSH. Provided with diabetes education and CBG monitor. Discharge on 60 units levemir every night and 20 units novolog three times daily with meals, and metformin. Has close follow up with PCP next week.   Acute kidney injury Patient presented with elevated Cr on admission likely in the setting of dehydration the setting of DKA. Previously Cr of around 1. On discharge Cr. Is improved to 1.2 this may be her new  baseline and may have some underlying kidney damge from uncontrolled diabetes.  Heart murmur Patient reports history of heart murmur that was diagnosed when she was little. She has not had any problems with her heart.States that she was told that she need antibiotics before any procedure. This could be mitral valve prolapse.Because patient has not have any issue for many  years, canconsider obtaining an echocardiogram outpatient for evaluation  Discharge Vitals:   BP (!) 139/95 (BP Location: Left Arm)   Pulse 94   Temp 98.5 F (36.9 C)   Resp 16   Ht 5' 6"  (1.676 m)   Wt 95.3 kg   LMP 10/25/2020 (Exact Date)   SpO2 100%   BMI 33.89 kg/m   Pertinent Labs, Studies, and Procedures:  CMP Latest Ref Rng & Units 10/27/2020 10/28/2020 10/27/2020  Glucose 70 - 99 mg/dL 134(H) 216(H) 445(H)  BUN 6 - 20 mg/dL 10 13 17   Creatinine 0.44 - 1.00 mg/dL 1.22(H) 1.24(H) 1.39(H)  Sodium 135 - 145 mmol/L 139 135 132(L)  Potassium 3.5 - 5.1 mmol/L 3.0(L) 4.1 4.2  Chloride 98 - 111 mmol/L 106 107 102  CO2 22 - 32 mmol/L 25 18(L) 20(L)  Calcium 8.9 - 10.3 mg/dL 8.8(L) 8.9 9.0  Total Protein - - - -  Total Bilirubin - - - -  Alkaline Phos - - - -  AST - - - -  ALT - - - -   CBG (last 3)  Recent Labs (last 2 labs)        Recent Labs    10/07/2020 0741 10/18/2020 1133 10/07/2020 1614  GLUCAP 184* 231* 237*       Discharge Instructions:     Discharge Instructions    Diet - low sodium heart healthy   Complete by: As directed    Discharge instructions   Complete by: As directed    You were hospitalized for diabetic ketoacidosis and new onset diabetes. Thank you for allowing [place] to be part of your care.    Please follow up with you primary care doctor next Wednesday.   Please start the follow medications: Novolog 20 units three times daily with meals Levemir 60 units at night Metformin 500 mg daily  Please make sure to monitor trends in your blood sugar. If you are experiencing blood sugars <70 please reduce novolog to 15 units and levemir to 50 units. If you blood sugars are consistently  above 200 please call your primary care doctor to have medications adjusted.  Please call our clinic if you have any questions or concerns, we may be able to help and keep you from a long and expensive emergency room wait. Our clinic and after hours phone  number is [phone], the best time to call is Monday through Friday 9 am to 4 pm but there is always someone available 24/7 if you have an emergency. If you need medication refills please notify your pharmacy one week in advance and they will send [place] a request.   Increase activity slowly   Complete by: As directed       Signed: Iona Beard, MD 10/30/2020, 8:09 AM   Pager: [phone]

## 2020-10-31 DEATH — deceased

## 2022-05-16 ENCOUNTER — Other Ambulatory Visit: Payer: Self-pay | Admitting: Family Medicine

## 2022-05-16 DIAGNOSIS — Z1231 Encounter for screening mammogram for malignant neoplasm of breast: Secondary | ICD-10-CM

## 2022-05-25 ENCOUNTER — Ambulatory Visit
Admission: RE | Admit: 2022-05-25 | Discharge: 2022-05-25 | Disposition: A | Discharge status: Home / Self Care | Payer: BC Managed Care – PPO | Source: Ambulatory Visit | Attending: Family Medicine | Admitting: Family Medicine

## 2022-05-25 DIAGNOSIS — Z1231 Encounter for screening mammogram for malignant neoplasm of breast: Secondary | ICD-10-CM

## 2023-05-15 ENCOUNTER — Other Ambulatory Visit: Payer: Self-pay | Admitting: Family Medicine

## 2023-05-15 DIAGNOSIS — Z1231 Encounter for screening mammogram for malignant neoplasm of breast: Secondary | ICD-10-CM

## 2023-05-29 ENCOUNTER — Ambulatory Visit
Admission: RE | Admit: 2023-05-29 | Discharge: 2023-05-29 | Disposition: A | Discharge status: Home / Self Care | Payer: BC Managed Care – PPO | Source: Ambulatory Visit | Attending: Family Medicine | Admitting: Family Medicine

## 2023-05-29 DIAGNOSIS — Z1231 Encounter for screening mammogram for malignant neoplasm of breast: Secondary | ICD-10-CM
